# Patient Record
Sex: Female | Born: 1935 | Race: White | Hispanic: No | Marital: Married | Smoking: Never smoker
Health system: Southern US, Community
[De-identification: ages and names within clinical notes are randomized; demographics above are authoritative.]

## PROBLEM LIST (undated history)

## (undated) DIAGNOSIS — I10 Essential (primary) hypertension: Secondary | ICD-10-CM

## (undated) DIAGNOSIS — I4891 Unspecified atrial fibrillation: Secondary | ICD-10-CM

## (undated) DIAGNOSIS — E119 Type 2 diabetes mellitus without complications: Secondary | ICD-10-CM

## (undated) DIAGNOSIS — M4800 Spinal stenosis, site unspecified: Secondary | ICD-10-CM

## (undated) DIAGNOSIS — M5136 Other intervertebral disc degeneration, lumbar region: Secondary | ICD-10-CM

## (undated) DIAGNOSIS — I499 Cardiac arrhythmia, unspecified: Secondary | ICD-10-CM

## (undated) DIAGNOSIS — H409 Unspecified glaucoma: Secondary | ICD-10-CM

## (undated) DIAGNOSIS — I2510 Atherosclerotic heart disease of native coronary artery without angina pectoris: Secondary | ICD-10-CM

## (undated) HISTORY — DX: Atherosclerotic heart disease of native coronary artery without angina pectoris: I25.10

## (undated) HISTORY — PX: CATARACT EXTRACTION, BILATERAL: SHX1313

## (undated) HISTORY — DX: Unspecified glaucoma: H40.9

## (undated) HISTORY — DX: Type 2 diabetes mellitus without complications: E11.9

## (undated) HISTORY — DX: Spinal stenosis, site unspecified: M48.00

## (undated) HISTORY — PX: COLONOSCOPY: SHX174

## (undated) HISTORY — DX: Unspecified atrial fibrillation: I48.91

## (undated) HISTORY — PX: HEMORRHOID SURGERY: SHX153

## (undated) HISTORY — DX: Other intervertebral disc degeneration, lumbar region: M51.36

## (undated) HISTORY — PX: BREAST SURGERY: SHX581

## (undated) HISTORY — PX: TUBAL LIGATION: SHX77

## (undated) HISTORY — DX: Essential (primary) hypertension: I10

---

## 1985-10-22 HISTORY — PX: CHOLECYSTECTOMY: SHX55

## 2001-10-22 HISTORY — PX: CORONARY ANGIOPLASTY WITH STENT PLACEMENT: SHX49

## 2011-10-23 HISTORY — PX: UMBILICAL HERNIA REPAIR: SHX196

## 2016-08-22 ENCOUNTER — Other Ambulatory Visit (HOSPITAL_COMMUNITY): Payer: Self-pay | Admitting: Surgery

## 2016-08-22 DIAGNOSIS — R202 Paresthesia of skin: Secondary | ICD-10-CM

## 2016-08-28 ENCOUNTER — Ambulatory Visit (HOSPITAL_COMMUNITY)
Admission: RE | Admit: 2016-08-28 | Discharge: 2016-08-28 | Disposition: A | Discharge status: Home / Self Care | Payer: Medicare Other | Source: Ambulatory Visit | Attending: Surgery | Admitting: Surgery

## 2016-08-28 DIAGNOSIS — R202 Paresthesia of skin: Secondary | ICD-10-CM | POA: Insufficient documentation

## 2016-08-28 DIAGNOSIS — I739 Peripheral vascular disease, unspecified: Secondary | ICD-10-CM | POA: Insufficient documentation

## 2016-10-22 HISTORY — PX: CORONARY ARTERY BYPASS GRAFT: SHX141

## 2017-05-28 ENCOUNTER — Encounter: Payer: Self-pay | Admitting: Internal Medicine

## 2017-07-22 ENCOUNTER — Other Ambulatory Visit: Payer: Self-pay

## 2017-07-22 ENCOUNTER — Encounter: Payer: Self-pay | Admitting: Gastroenterology

## 2017-07-22 ENCOUNTER — Ambulatory Visit (INDEPENDENT_AMBULATORY_CARE_PROVIDER_SITE_OTHER): Payer: Medicare Other | Admitting: Gastroenterology

## 2017-07-22 DIAGNOSIS — K921 Melena: Secondary | ICD-10-CM | POA: Diagnosis not present

## 2017-07-22 DIAGNOSIS — R195 Other fecal abnormalities: Secondary | ICD-10-CM

## 2017-07-22 MED ORDER — NA SULFATE-K SULFATE-MG SULF 17.5-3.13-1.6 GM/177ML PO SOLN: 1.0000 | ORAL | 0 refills | Status: DC

## 2017-07-22 NOTE — Patient Instructions (Signed)
1. Colonoscopy and possible upper endoscopy as scheduled. Please see separate instructions.

## 2017-07-22 NOTE — Assessment & Plan Note (Signed)
82 year old female presenting for further evaluation of Hemoccult-positive stool, blood in stool. Stools are dark, turns the toilet water red at times. Some increased stool frequency, loose stools. Patient previously on Eliquis after her CABG in January because she developed postop A. fib. She has been off blood thinner since August. Remote colonoscopy. Discussed with patient, consider colonoscopy with possible upper endoscopy for further evaluation of heme-positive stool. Of note patient has had a decline in her ferritin overall her hemoglobin remains low normal.  I have discussed the risks, alternatives, benefits with regards to but not limited to the risk of reaction to medication, bleeding, infection, perforation and the patient is agreeable to proceed. Written consent to be obtained.

## 2017-07-22 NOTE — Progress Notes (Signed)
Primary Care Physician:  Earney Mallet, MD  Primary Gastroenterologist:  Garfield Cornea, MD   Chief Complaint  Patient presents with  . positive stool card    HPI:  Shelley Watson is a 82 y.o. female here at the request of Dr. Macarthur Critchley for further evaluation of Hemoccult-positive stool. Patient underwent a CABG in January, she developed postoperative atrial fibrillation requiring amiodarone and Eliquis. According to PCP may she had a decline in her ferritin from 81-34 in the past 3 months on iron supplement. This was reason for checking her stools which turned out positive. Patient seemed 1 is normal at 11.7, hematocrit 36.4. Labs were from August 2018. Ferritin low normal at 34.  Patient reports taking iron supplement since her cardiac surgery. Stop Eliquis 06/08/17. No longer on amiodarone. She did develop a large hematoma on the left wrist back in April while on Eliquis.   Patient reports that her cardiologist, Dr. Alroy Dust, plans to keep her off blood thinners as long as she does not get back into A. fib but if she does she will require lifelong anticoagulation. He has encouraged her to pursue a workup of heme-positive stools.  Patient has had a colonoscopy years ago, results unavailable. She states her stools are very dark. She is on iron. At times the stool turns the water red however. Denies fresh blood on the toilet tissue. No current reflux issues. Years ago she had take Prevacid. Previously did not tolerate Prilosec gastric disturbances per patient. She eats multiple small meals daily which controls her reflux. More recently she is having up to 4-5 stools a day, sometimes loose. Rarely has constipation. Denies abdominal pain.     Current Outpatient Prescriptions  Medication Sig Dispense Refill  . Acetaminophen (TYLENOL ARTHRITIS PAIN PO) Take by mouth as needed.    Marland Kitchen aspirin 81 MG chewable tablet Chew 81 mg by mouth daily.    Marland Kitchen atorvastatin (LIPITOR) 40 MG tablet Take 1 tablet by mouth  daily.    . cholecalciferol (VITAMIN D) 1000 units tablet Take 1,000 Units by mouth daily.    . Ferrous Sulfate (IRON) 325 (65 Fe) MG TABS Take 1 tablet by mouth daily.    . fluticasone (FLONASE) 50 MCG/ACT nasal spray Place 1 spray into both nostrils as needed for allergies or rhinitis.    . furosemide (LASIX) 40 MG tablet Take 1 tablet by mouth daily.    Marland Kitchen glimepiride (AMARYL) 4 MG tablet Take 2 mg by mouth daily.    Marland Kitchen latanoprost (XALATAN) 0.005 % ophthalmic solution Place 1 drop into both eyes at bedtime.    Marland Kitchen lisinopril (PRINIVIL,ZESTRIL) 5 MG tablet Take 5 mg by mouth daily.    . metFORMIN (GLUCOPHAGE) 1000 MG tablet Take 1,000 mg by mouth 2 (two) times daily.    . Multiple Vitamin (MULTIVITAMIN) tablet Take 1 tablet by mouth. Takes when she remembers    . potassium chloride SA (K-DUR,KLOR-CON) 20 MEQ tablet Take 1 tablet by mouth daily.    . timolol (BETIMOL) 0.5 % ophthalmic solution Place 1 drop into both eyes every morning.    . vitamin C (ASCORBIC ACID) 250 MG tablet Take 250 mg by mouth daily.    . Na Sulfate-K Sulfate-Mg Sulf (SUPREP BOWEL PREP KIT) 17.5-3.13-1.6 GM/180ML SOLN Take 1 kit by mouth as directed. 1 Bottle 0   No current facility-administered medications for this visit.     Allergies as of 07/22/2017 - Review Complete 07/22/2017  Allergen Reaction Noted  . Aleve [naproxen sodium] Shortness  Of Breath 07/22/2017  . Garlic  74/25/9563  . Other  07/22/2017  . Prednisone  07/22/2017  . Penicillins Rash 07/22/2017    Past Medical History:  Diagnosis Date  . A-fib ([place])    after CABG  . CAD (coronary artery disease)    stent placement  . DDD (degenerative disc disease), lumbar   . Diabetes ([place])   . Glaucoma   . Hypertension   . Spinal stenosis     Past Surgical History:  Procedure Laterality Date  . BREAST SURGERY     X5  . CATARACT EXTRACTION, BILATERAL    . CHOLECYSTECTOMY  1987  . COLONOSCOPY     Dr. Algis Greenhouse  . CORONARY ANGIOPLASTY WITH STENT  PLACEMENT  2003  . CORONARY ARTERY BYPASS GRAFT  10/2016   [facility]  . TUBAL LIGATION    . UMBILICAL HERNIA REPAIR  2013    Family History  Problem Relation Age of Onset  . Bladder Cancer Brother   . Colon cancer Neg Hx     Social History   Social History  . Marital status: Married    Spouse name: N/A  . Number of children: N/A  . Years of education: N/A   Occupational History  . Not on file.   Social History Main Topics  . Smoking status: Never Smoker  . Smokeless tobacco: Never Used  . Alcohol use No  . Drug use: No  . Sexual activity: Not on file   Other Topics Concern  . Not on file   Social History Narrative  . No narrative on file      ROS:  General: Negative for anorexia, weight loss, fever, chills,Positive fatigue fatigue, weakness. Eyes: Negative for vision changes.  ENT: Negative for hoarseness, difficulty swallowing , nasal congestion. CV: Negative for chest pain, angina, palpitations, dyspnea on exertion, peripheral edema.  Respiratory: Negative for dyspnea at rest, dyspnea on exertion, cough, sputum, wheezing.  GI: See history of present illness. GU:  Negative for dysuria, hematuria, urinary incontinence, urinary frequency, nocturnal urination.  MS: Negative for joint pain, low back pain.  Derm: Negative for rash or itching.  Neuro: Negative for weakness, abnormal sensation, seizure, frequent headaches, memory loss, confusion.  Psych: Negative for anxiety, depression, suicidal ideation, hallucinations.  Endo: Negative for unusual weight change.  Heme: Negative for bruising or bleeding. Allergy: Negative for rash or hives.    Physical Examination:  BP (!) 147/82   Pulse 71   Temp (!) 97.2 F (36.2 C) (Oral)   Ht 5' 2.5" (1.588 m)   Wt 194 lb 12.8 oz (88.4 kg)   BMI 35.06 kg/m    General: Well-nourished, well-developed in no acute distress. Accompanied by spouse Head: Normocephalic, atraumatic.   Eyes: Conjunctiva pink, no icterus. Mouth:  Oropharyngeal mucosa moist and pink , no lesions erythema or exudate. Neck: Supple without thyromegaly, masses, or lymphadenopathy.  Lungs: Clear to auscultation bilaterally.  Heart: Regular rate and rhythm, no murmurs rubs or gallops.  Abdomen: Bowel sounds are normal, nontender, nondistended, no hepatosplenomegaly or masses, no abdominal bruits or    hernia , no rebound or guarding.   Rectal: Not performed Extremities: No lower extremity edema. No clubbing or deformities.  Neuro: Alert and oriented x 4 , grossly normal neurologically.  Skin: Warm and dry, no rash or jaundice.   Psych: Alert and cooperative, normal mood and affect.  Labs: Labs from July 2018 Ferritin 34, white blood cell count 8200, hemoglobin 11.7 normal, hematocrit 36.4, MCV 82,  platelets 233,000, albumin 4, total bilirubin 0.4, alkaline phosphatase 82, AST 16, ALT 10, creatinine 1.1, BUN 15, hemoglobin A1c 6.9.  Imaging Studies: No results found.

## 2017-08-07 ENCOUNTER — Encounter (HOSPITAL_COMMUNITY): Payer: Self-pay | Admitting: *Deleted

## 2017-08-07 ENCOUNTER — Ambulatory Visit (HOSPITAL_COMMUNITY)
Admission: RE | Admit: 2017-08-07 | Discharge: 2017-08-07 | Disposition: A | Discharge status: Home / Self Care | Payer: Medicare Other | Source: Ambulatory Visit | Attending: Internal Medicine | Admitting: Internal Medicine

## 2017-08-07 ENCOUNTER — Encounter (HOSPITAL_COMMUNITY)
Admission: RE | Disposition: A | Discharge status: Home / Self Care | Payer: Self-pay | Source: Ambulatory Visit | Attending: Internal Medicine

## 2017-08-07 DIAGNOSIS — I4891 Unspecified atrial fibrillation: Secondary | ICD-10-CM | POA: Diagnosis not present

## 2017-08-07 DIAGNOSIS — M5136 Other intervertebral disc degeneration, lumbar region: Secondary | ICD-10-CM | POA: Insufficient documentation

## 2017-08-07 DIAGNOSIS — Z9049 Acquired absence of other specified parts of digestive tract: Secondary | ICD-10-CM | POA: Insufficient documentation

## 2017-08-07 DIAGNOSIS — Z9841 Cataract extraction status, right eye: Secondary | ICD-10-CM | POA: Insufficient documentation

## 2017-08-07 DIAGNOSIS — Z951 Presence of aortocoronary bypass graft: Secondary | ICD-10-CM | POA: Insufficient documentation

## 2017-08-07 DIAGNOSIS — M4800 Spinal stenosis, site unspecified: Secondary | ICD-10-CM | POA: Insufficient documentation

## 2017-08-07 DIAGNOSIS — K317 Polyp of stomach and duodenum: Secondary | ICD-10-CM | POA: Diagnosis not present

## 2017-08-07 DIAGNOSIS — Z880 Allergy status to penicillin: Secondary | ICD-10-CM | POA: Insufficient documentation

## 2017-08-07 DIAGNOSIS — Z888 Allergy status to other drugs, medicaments and biological substances status: Secondary | ICD-10-CM | POA: Insufficient documentation

## 2017-08-07 DIAGNOSIS — I2510 Atherosclerotic heart disease of native coronary artery without angina pectoris: Secondary | ICD-10-CM | POA: Insufficient documentation

## 2017-08-07 DIAGNOSIS — Z7982 Long term (current) use of aspirin: Secondary | ICD-10-CM | POA: Diagnosis not present

## 2017-08-07 DIAGNOSIS — K921 Melena: Secondary | ICD-10-CM | POA: Insufficient documentation

## 2017-08-07 DIAGNOSIS — I10 Essential (primary) hypertension: Secondary | ICD-10-CM | POA: Insufficient documentation

## 2017-08-07 DIAGNOSIS — Z7901 Long term (current) use of anticoagulants: Secondary | ICD-10-CM | POA: Insufficient documentation

## 2017-08-07 DIAGNOSIS — Z9842 Cataract extraction status, left eye: Secondary | ICD-10-CM | POA: Diagnosis not present

## 2017-08-07 DIAGNOSIS — Z7984 Long term (current) use of oral hypoglycemic drugs: Secondary | ICD-10-CM | POA: Insufficient documentation

## 2017-08-07 DIAGNOSIS — Z91018 Allergy to other foods: Secondary | ICD-10-CM | POA: Diagnosis not present

## 2017-08-07 DIAGNOSIS — K219 Gastro-esophageal reflux disease without esophagitis: Secondary | ICD-10-CM | POA: Diagnosis not present

## 2017-08-07 DIAGNOSIS — D122 Benign neoplasm of ascending colon: Secondary | ICD-10-CM | POA: Insufficient documentation

## 2017-08-07 DIAGNOSIS — H409 Unspecified glaucoma: Secondary | ICD-10-CM | POA: Diagnosis not present

## 2017-08-07 DIAGNOSIS — R195 Other fecal abnormalities: Secondary | ICD-10-CM

## 2017-08-07 DIAGNOSIS — K640 First degree hemorrhoids: Secondary | ICD-10-CM | POA: Insufficient documentation

## 2017-08-07 DIAGNOSIS — Z79899 Other long term (current) drug therapy: Secondary | ICD-10-CM | POA: Insufficient documentation

## 2017-08-07 DIAGNOSIS — Z8052 Family history of malignant neoplasm of bladder: Secondary | ICD-10-CM | POA: Insufficient documentation

## 2017-08-07 DIAGNOSIS — E119 Type 2 diabetes mellitus without complications: Secondary | ICD-10-CM | POA: Diagnosis not present

## 2017-08-07 DIAGNOSIS — Z955 Presence of coronary angioplasty implant and graft: Secondary | ICD-10-CM | POA: Insufficient documentation

## 2017-08-07 HISTORY — PX: POLYPECTOMY: SHX5525

## 2017-08-07 HISTORY — DX: Cardiac arrhythmia, unspecified: I49.9

## 2017-08-07 HISTORY — PX: COLONOSCOPY: SHX5424

## 2017-08-07 HISTORY — PX: ESOPHAGOGASTRODUODENOSCOPY: SHX5428

## 2017-08-07 LAB — GLUCOSE, CAPILLARY: Glucose-Capillary: 174 mg/dL — ABNORMAL HIGH (ref 65–99)

## 2017-08-07 SURGERY — COLONOSCOPY
Anesthesia: Moderate Sedation

## 2017-08-07 MED ORDER — ONDANSETRON HCL 4 MG/2ML IJ SOLN
INTRAMUSCULAR | Status: AC
  Filled 2017-08-07: qty 2

## 2017-08-07 MED ORDER — MEPERIDINE HCL 100 MG/ML IJ SOLN
INTRAMUSCULAR | Status: DC | PRN
  Administered 2017-08-07 (×2): 25 mg

## 2017-08-07 MED ORDER — MIDAZOLAM HCL 5 MG/5ML IJ SOLN
INTRAMUSCULAR | Status: DC | PRN
  Administered 2017-08-07 (×5): 1 mg via INTRAVENOUS

## 2017-08-07 MED ORDER — MEPERIDINE HCL 100 MG/ML IJ SOLN
INTRAMUSCULAR | Status: AC
  Filled 2017-08-07: qty 2

## 2017-08-07 MED ORDER — ATROPINE SULFATE 1 MG/ML IJ SOLN
INTRAMUSCULAR | Status: AC
  Filled 2017-08-07: qty 1

## 2017-08-07 MED ORDER — EPINEPHRINE PF 1 MG/10ML IJ SOSY
PREFILLED_SYRINGE | INTRAMUSCULAR | Status: AC
  Filled 2017-08-07: qty 10

## 2017-08-07 MED ORDER — LIDOCAINE VISCOUS 2 % MT SOLN
OROMUCOSAL | Status: DC | PRN
  Administered 2017-08-07: 1 via OROMUCOSAL

## 2017-08-07 MED ORDER — NALOXONE HCL 0.4 MG/ML IJ SOLN
INTRAMUSCULAR | Status: AC
  Filled 2017-08-07: qty 1

## 2017-08-07 MED ORDER — SODIUM CHLORIDE 0.9 % IV SOLN
INTRAVENOUS | Status: DC
  Administered 2017-08-07: 07:00:00 via INTRAVENOUS

## 2017-08-07 MED ORDER — LIDOCAINE VISCOUS 2 % MT SOLN
OROMUCOSAL | Status: AC
  Filled 2017-08-07: qty 15

## 2017-08-07 MED ORDER — STERILE WATER FOR IRRIGATION IR SOLN
Status: DC | PRN
  Administered 2017-08-07: 2.5 mL

## 2017-08-07 MED ORDER — MIDAZOLAM HCL 5 MG/5ML IJ SOLN
INTRAMUSCULAR | Status: AC
  Filled 2017-08-07: qty 10

## 2017-08-07 MED ORDER — ONDANSETRON HCL 4 MG/2ML IJ SOLN
INTRAMUSCULAR | Status: DC | PRN
  Administered 2017-08-07: 4 mg via INTRAVENOUS

## 2017-08-07 MED ORDER — FLUMAZENIL 0.5 MG/5ML IV SOLN
INTRAVENOUS | Status: AC
  Filled 2017-08-07: qty 5

## 2017-08-07 NOTE — Op Note (Addendum)
[facility] Patient Name: Shelley Watson Procedure Date: 08/07/2017 7:10 AM MRN: [id number] Date of Birth: [birth year] Attending MD: Norvel Richards , MD CSN: [id number] Age: 82 Admit Type: Outpatient Procedure:                Colonoscopy Indications:              Heme positive stool Providers:                Norvel Richards, MD, Selena Lesser, Lurline Del, RN, Aram Candela Referring MD:              Medicines:                Midazolam 2 mg IV, Meperidine 25 mg IV, Ondansetron                            4 mg IV Complications:            No immediate complications. Estimated Blood Loss:     Estimated blood loss was minimal. Procedure:                Pre-Anesthesia Assessment:                           - Prior to the procedure, a History and Physical                            was performed, and patient medications and                            allergies were reviewed. The patient's tolerance of                            previous anesthesia was also reviewed. The risks                            and benefits of the procedure and the sedation                            options and risks were discussed with the patient.                            All questions were answered, and informed consent                            was obtained. Prior Anticoagulants: The patient has                            taken no previous anticoagulant or antiplatelet                            agents. ASA Grade Assessment: II - A patient with  mild systemic disease. After reviewing the risks                            and benefits, the patient was deemed in                            satisfactory condition to undergo the procedure.                           After obtaining informed consent, the colonoscope                            was passed under direct vision. Throughout the                            procedure, the patient's blood  pressure, pulse, and                            oxygen saturations were monitored continuously. The                            EC-[id number] ([id number]) scope was introduced through                            the anus and advanced to the the cecum, identified                            by appendiceal orifice and ileocecal valve. The                            colonoscopy was performed without difficulty. The                            patient tolerated the procedure well. The quality                            of the bowel preparation was adequate. The entire                            colon was well visualized. The ileocecal valve,                            appendiceal orifice, and rectum were photographed.                            The quality of the bowel preparation was adequate. Scope In: 7:52:31 AM Scope Out: 8:04:12 AM Scope Withdrawal Time: 0 hours 7 minutes 34 seconds  Total Procedure Duration: 0 hours 11 minutes 41 seconds  Findings:      The perianal and digital rectal examinations were normal.      A 6 mm polyp was found in the ascending colon. The polyp was sessile.       The polyp was removed with a cold snare. Resection and retrieval were  complete. Estimated blood loss was minimal.      Internal hemorrhoids were found during retroflexion. The hemorrhoids       were mild, small and Grade I (internal hemorrhoids that do not prolapse).      The exam was otherwise without abnormality on direct and retroflexion       views. Impression:               - One 6 mm polyp in the ascending colon, removed                            with a cold snare. Resected and retrieved.                           - Internal hemorrhoids.                           - The examination was otherwise normal on direct                            and retroflexion views. Moderate Sedation:      Moderate (conscious) sedation was administered by the endoscopy nurse       and supervised by the endoscopist.  The following parameters were       monitored: oxygen saturation, heart rate, blood pressure, respiratory       rate, EKG, adequacy of pulmonary ventilation, and response to care.       Total physician intraservice time was 19 minutes. Recommendation:           - Patient has a contact number available for                            emergencies. The signs and symptoms of potential                            delayed complications were discussed with the                            patient. Return to normal activities tomorrow.                            Written discharge instructions were provided to the                            patient.                           - Resume previous diet.                           - Continue present medications.                           - No repeat colonoscopy due to age.                           - Return to GI clinic (date not yet determined).  See EGD report. Procedure Code(s):        --- Professional ---                           [phone], Colonoscopy, flexible; with removal of                            tumor(s), polyp(s), or other lesion(s) by snare                            technique                           99152, Moderate sedation services provided by the                            same physician or other qualified health care                            professional performing the diagnostic or                            therapeutic service that the sedation supports,                            requiring the presence of an independent trained                            observer to assist in the monitoring of the                            patient's level of consciousness and physiological                            status; initial 15 minutes of intraservice time,                            patient age 24 years or older Diagnosis Code(s):        --- Professional ---                           D12.2, Benign neoplasm of  ascending colon                           K64.0, First degree hemorrhoids                           R19.5, Other fecal abnormalities CPT copyright 2016 American Medical Association. All rights reserved. The codes documented in this report are preliminary and upon coder review may  be revised to meet current compliance requirements. [name]. Nicholos Aloisi, MD Norvel Richards, MD 08/07/2017 8:09:55 AM This report has been signed electronically. Number of Addenda: 0

## 2017-08-07 NOTE — Discharge Instructions (Addendum)
Colon polyp information provided  Further recommendations to follow pending review of pathology report  No MRI until clips noted longer present.  Colonoscopy Discharge Instructions  Read the instructions outlined below and refer to this sheet in the next few weeks. These discharge instructions provide you with general information on caring for yourself after you leave the hospital. Your doctor may also give you specific instructions. While your treatment has been planned according to the most current medical practices available, unavoidable complications occasionally occur. If you have any problems or questions after discharge, call Dr. Gala Romney at [phone]. ACTIVITY  You may resume your regular activity, but move at a slower pace for the next 24 hours.   Take frequent rest periods for the next 24 hours.   Walking will help get rid of the air and reduce the bloated feeling in your belly (abdomen).   No driving for 24 hours (because of the medicine (anesthesia) used during the test).    Do not sign any important legal documents or operate any machinery for 24 hours (because of the anesthesia used during the test).  NUTRITION  Drink plenty of fluids.   You may resume your normal diet as instructed by your doctor.   Begin with a light meal and progress to your normal diet. Heavy or fried foods are harder to digest and may make you feel sick to your stomach (nauseated).   Avoid alcoholic beverages for 24 hours or as instructed.  MEDICATIONS  You may resume your normal medications unless your doctor tells you otherwise.  WHAT YOU CAN EXPECT TODAY  Some feelings of bloating in the abdomen.   Passage of more gas than usual.   Spotting of blood in your stool or on the toilet paper.  IF YOU HAD POLYPS REMOVED DURING THE COLONOSCOPY:  No aspirin products for 7 days or as instructed.   No alcohol for 7 days or as instructed.   Eat a soft diet for the next 24 hours.  FINDING OUT THE  RESULTS OF YOUR TEST Not all test results are available during your visit. If your test results are not back during the visit, make an appointment with your caregiver to find out the results. Do not assume everything is normal if you have not heard from your caregiver or the medical facility. It is important for you to follow up on all of your test results.  SEEK IMMEDIATE MEDICAL ATTENTION IF:  You have more than a spotting of blood in your stool.   Your belly is swollen (abdominal distention).   You are nauseated or vomiting.   You have a temperature over 101.   You have abdominal pain or discomfort that is severe or gets worse throughout the day.  EGD Discharge instructions Please read the instructions outlined below and refer to this sheet in the next few weeks. These discharge instructions provide you with general information on caring for yourself after you leave the hospital. Your doctor may also give you specific instructions. While your treatment has been planned according to the most current medical practices available, unavoidable complications occasionally occur. If you have any problems or questions after discharge, please call your doctor. ACTIVITY  You may resume your regular activity but move at a slower pace for the next 24 hours.   Take frequent rest periods for the next 24 hours.   Walking will help expel (get rid of) the air and reduce the bloated feeling in your abdomen.   No driving for  24 hours (because of the anesthesia (medicine) used during the test).   You may shower.   Do not sign any important legal documents or operate any machinery for 24 hours (because of the anesthesia used during the test).  NUTRITION  Drink plenty of fluids.   You may resume your normal diet.   Begin with a light meal and progress to your normal diet.   Avoid alcoholic beverages for 24 hours or as instructed by your caregiver.  MEDICATIONS  You may resume your normal  medications unless your caregiver tells you otherwise.  WHAT YOU CAN EXPECT TODAY  You may experience abdominal discomfort such as a feeling of fullness or gas pains.  FOLLOW-UP  Your doctor will discuss the results of your test with you.  SEEK IMMEDIATE MEDICAL ATTENTION IF ANY OF THE FOLLOWING OCCUR:  Excessive nausea (feeling sick to your stomach) and/or vomiting.   Severe abdominal pain and distention (swelling).   Trouble swallowing.   Temperature over 101 F (37.8 C).   Rectal bleeding or vomiting of blood.    Colon Polyps Polyps are tissue growths inside the body. Polyps can grow in many places, including the large intestine (colon). A polyp may be a round bump or a mushroom-shaped growth. You could have one polyp or several. Most colon polyps are noncancerous (benign). However, some colon polyps can become cancerous over time. What are the causes? The exact cause of colon polyps is not known. What increases the risk? This condition is more likely to develop in people who:  Have a family history of colon cancer or colon polyps.  Are older than 60 or older than 45 if they are African American.  Have inflammatory bowel disease, such as ulcerative colitis or Crohn disease.  Are overweight.  Smoke cigarettes.  Do not get enough exercise.  Drink too much alcohol.  Eat a diet that is: ? High in fat and red meat. ? Low in fiber.  Had childhood cancer that was treated with abdominal radiation.  What are the signs or symptoms? Most polyps do not cause symptoms. If you have symptoms, they may include:  Blood coming from your rectum when having a bowel movement.  Blood in your stool.The stool may look dark red or black.  A change in bowel habits, such as constipation or diarrhea.  How is this diagnosed? This condition is diagnosed with a colonoscopy. This is a procedure that uses a lighted, flexible scope to look at the inside of your colon. How is this  treated? Treatment for this condition involves removing any polyps that are found. Those polyps will then be tested for cancer. If cancer is found, your health care provider will talk to you about options for colon cancer treatment. Follow these instructions at home: Diet  Eat plenty of fiber, such as fruits, vegetables, and whole grains.  Eat foods that are high in calcium and vitamin D, such as milk, cheese, yogurt, eggs, liver, fish, and broccoli.  Limit foods high in fat, red meats, and processed meats, such as hot dogs, sausage, bacon, and lunch meats.  Maintain a healthy weight, or lose weight if recommended by your health care provider. General instructions  Do not smoke cigarettes.  Do not drink alcohol excessively.  Keep all follow-up visits as told by your health care provider. This is important. This includes keeping regularly scheduled colonoscopies. Talk to your health care provider about when you need a colonoscopy.  Exercise every day or as told by  your health care provider. Contact a health care provider if:  You have new or worsening bleeding during a bowel movement.  You have new or increased blood in your stool.  You have a change in bowel habits.  You unexpectedly lose weight. This information is not intended to replace advice given to you by your health care provider. Make sure you discuss any questions you have with your health care provider. Document Released: 07/04/2004 Document Revised: 03/15/2016 Document Reviewed: 08/29/2015 Elsevier Interactive Patient Education  [employer].

## 2017-08-07 NOTE — H&P (View-Only) (Signed)
Primary Care Physician:  Earney Mallet, MD  Primary Gastroenterologist:  Garfield Cornea, MD   Chief Complaint  Patient presents with  . positive stool card    HPI:  Shelley Watson is a 82 y.o. female here at the request of Dr. Macarthur Critchley for further evaluation of Hemoccult-positive stool. Patient underwent a CABG in January, she developed postoperative atrial fibrillation requiring amiodarone and Eliquis. According to PCP may she had a decline in her ferritin from 81-34 in the past 3 months on iron supplement. This was reason for checking her stools which turned out positive. Patient seemed 1 is normal at 11.7, hematocrit 36.4. Labs were from August 2018. Ferritin low normal at 34.  Patient reports taking iron supplement since her cardiac surgery. Stop Eliquis 06/08/17. No longer on amiodarone. She did develop a large hematoma on the left wrist back in April while on Eliquis.   Patient reports that her cardiologist, Dr. Alroy Dust, plans to keep her off blood thinners as long as she does not get back into A. fib but if she does she will require lifelong anticoagulation. He has encouraged her to pursue a workup of heme-positive stools.  Patient has had a colonoscopy years ago, results unavailable. She states her stools are very dark. She is on iron. At times the stool turns the water red however. Denies fresh blood on the toilet tissue. No current reflux issues. Years ago she had take Prevacid. Previously did not tolerate Prilosec gastric disturbances per patient. She eats multiple small meals daily which controls her reflux. More recently she is having up to 4-5 stools a day, sometimes loose. Rarely has constipation. Denies abdominal pain.     Current Outpatient Prescriptions  Medication Sig Dispense Refill  . Acetaminophen (TYLENOL ARTHRITIS PAIN PO) Take by mouth as needed.    Marland Kitchen aspirin 81 MG chewable tablet Chew 81 mg by mouth daily.    Marland Kitchen atorvastatin (LIPITOR) 40 MG tablet Take 1 tablet by mouth  daily.    . cholecalciferol (VITAMIN D) 1000 units tablet Take 1,000 Units by mouth daily.    . Ferrous Sulfate (IRON) 325 (65 Fe) MG TABS Take 1 tablet by mouth daily.    . fluticasone (FLONASE) 50 MCG/ACT nasal spray Place 1 spray into both nostrils as needed for allergies or rhinitis.    . furosemide (LASIX) 40 MG tablet Take 1 tablet by mouth daily.    Marland Kitchen glimepiride (AMARYL) 4 MG tablet Take 2 mg by mouth daily.    Marland Kitchen latanoprost (XALATAN) 0.005 % ophthalmic solution Place 1 drop into both eyes at bedtime.    Marland Kitchen lisinopril (PRINIVIL,ZESTRIL) 5 MG tablet Take 5 mg by mouth daily.    . metFORMIN (GLUCOPHAGE) 1000 MG tablet Take 1,000 mg by mouth 2 (two) times daily.    . Multiple Vitamin (MULTIVITAMIN) tablet Take 1 tablet by mouth. Takes when she remembers    . potassium chloride SA (K-DUR,KLOR-CON) 20 MEQ tablet Take 1 tablet by mouth daily.    . timolol (BETIMOL) 0.5 % ophthalmic solution Place 1 drop into both eyes every morning.    . vitamin C (ASCORBIC ACID) 250 MG tablet Take 250 mg by mouth daily.    . Na Sulfate-K Sulfate-Mg Sulf (SUPREP BOWEL PREP KIT) 17.5-3.13-1.6 GM/180ML SOLN Take 1 kit by mouth as directed. 1 Bottle 0   No current facility-administered medications for this visit.     Allergies as of 07/22/2017 - Review Complete 07/22/2017  Allergen Reaction Noted  . Aleve [naproxen sodium] Shortness  Of Breath 07/22/2017  . Garlic  74/25/9563  . Other  07/22/2017  . Prednisone  07/22/2017  . Penicillins Rash 07/22/2017    Past Medical History:  Diagnosis Date  . A-fib ([place])    after CABG  . CAD (coronary artery disease)    stent placement  . DDD (degenerative disc disease), lumbar   . Diabetes ([place])   . Glaucoma   . Hypertension   . Spinal stenosis     Past Surgical History:  Procedure Laterality Date  . BREAST SURGERY     X5  . CATARACT EXTRACTION, BILATERAL    . CHOLECYSTECTOMY  1987  . COLONOSCOPY     Dr. Algis Greenhouse  . CORONARY ANGIOPLASTY WITH STENT  PLACEMENT  2003  . CORONARY ARTERY BYPASS GRAFT  10/2016   [facility]  . TUBAL LIGATION    . UMBILICAL HERNIA REPAIR  2013    Family History  Problem Relation Age of Onset  . Bladder Cancer Brother   . Colon cancer Neg Hx     Social History   Social History  . Marital status: Married    Spouse name: N/A  . Number of children: N/A  . Years of education: N/A   Occupational History  . Not on file.   Social History Main Topics  . Smoking status: Never Smoker  . Smokeless tobacco: Never Used  . Alcohol use No  . Drug use: No  . Sexual activity: Not on file   Other Topics Concern  . Not on file   Social History Narrative  . No narrative on file      ROS:  General: Negative for anorexia, weight loss, fever, chills,Positive fatigue fatigue, weakness. Eyes: Negative for vision changes.  ENT: Negative for hoarseness, difficulty swallowing , nasal congestion. CV: Negative for chest pain, angina, palpitations, dyspnea on exertion, peripheral edema.  Respiratory: Negative for dyspnea at rest, dyspnea on exertion, cough, sputum, wheezing.  GI: See history of present illness. GU:  Negative for dysuria, hematuria, urinary incontinence, urinary frequency, nocturnal urination.  MS: Negative for joint pain, low back pain.  Derm: Negative for rash or itching.  Neuro: Negative for weakness, abnormal sensation, seizure, frequent headaches, memory loss, confusion.  Psych: Negative for anxiety, depression, suicidal ideation, hallucinations.  Endo: Negative for unusual weight change.  Heme: Negative for bruising or bleeding. Allergy: Negative for rash or hives.    Physical Examination:  BP (!) 147/82   Pulse 71   Temp (!) 97.2 F (36.2 C) (Oral)   Ht 5' 2.5" (1.588 m)   Wt 194 lb 12.8 oz (88.4 kg)   BMI 35.06 kg/m    General: Well-nourished, well-developed in no acute distress. Accompanied by spouse Head: Normocephalic, atraumatic.   Eyes: Conjunctiva pink, no icterus. Mouth:  Oropharyngeal mucosa moist and pink , no lesions erythema or exudate. Neck: Supple without thyromegaly, masses, or lymphadenopathy.  Lungs: Clear to auscultation bilaterally.  Heart: Regular rate and rhythm, no murmurs rubs or gallops.  Abdomen: Bowel sounds are normal, nontender, nondistended, no hepatosplenomegaly or masses, no abdominal bruits or    hernia , no rebound or guarding.   Rectal: Not performed Extremities: No lower extremity edema. No clubbing or deformities.  Neuro: Alert and oriented x 4 , grossly normal neurologically.  Skin: Warm and dry, no rash or jaundice.   Psych: Alert and cooperative, normal mood and affect.  Labs: Labs from July 2018 Ferritin 34, white blood cell count 8200, hemoglobin 11.7 normal, hematocrit 36.4, MCV 82,  platelets 233,000, albumin 4, total bilirubin 0.4, alkaline phosphatase 82, AST 16, ALT 10, creatinine 1.1, BUN 15, hemoglobin A1c 6.9.  Imaging Studies: No results found.

## 2017-08-07 NOTE — Interval H&P Note (Signed)
History and Physical Interval Note:  08/07/2017 7:34 AM  Lovett Sox  has presented today for surgery, with the diagnosis of heme+ stool  The various methods of treatment have been discussed with the patient and family. After consideration of risks, benefits and other options for treatment, the patient has consented to  Procedure(s) with comments: COLONOSCOPY (N/A) - 7:30am ESOPHAGOGASTRODUODENOSCOPY (EGD) (N/A) as a surgical intervention .  The patient's history has been reviewed, patient examined, no change in status, stable for surgery.  I have reviewed the patient's chart and labs.  Questions were answered to the patient's satisfaction.     Shelley Watson  no change.  Diagnostic colonoscopy and EGD per plan.  The risks, benefits, limitations, imponderables and alternatives regarding both EGD and colonoscopy have been reviewed with the patient. Questions have been answered. All parties agreeable.

## 2017-08-07 NOTE — Op Note (Signed)
[facility] Patient Name: Shelley Watson Procedure Date: 08/07/2017 8:04 AM MRN: [id number] Date of Birth: [birth year] Attending MD: Norvel Richards , MD CSN: [id number] Age: 82 Admit Type: Outpatient Procedure:                Upper GI endoscopy Indications:              Heme positive stool Providers:                Norvel Richards, MD, Selena Lesser, Aram Candela Referring MD:              Medicines:                Midazolam 5 mg IV, Meperidine 50 mg IV, Ondansetron                            4 mg IV Complications:            No immediate complications. Estimated Blood Loss:     Estimated blood loss was minimal. Procedure:                Pre-Anesthesia Assessment:                           - Prior to the procedure, a History and Physical                            was performed, and patient medications and                            allergies were reviewed. The patient's tolerance of                            previous anesthesia was also reviewed. The risks                            and benefits of the procedure and the sedation                            options and risks were discussed with the patient.                            All questions were answered, and informed consent                            was obtained. Prior Anticoagulants: The patient has                            taken no previous anticoagulant or antiplatelet                            agents. ASA Grade Assessment: II - A patient with  mild systemic disease. After reviewing the risks                            and benefits, the patient was deemed in                            satisfactory condition to undergo the procedure.                           After obtaining informed consent, the endoscope was                            passed under direct vision. Throughout the                            procedure, the patient's blood pressure, pulse,  and                            oxygen saturations were monitored continuously. The                            [id number] ([id number]) scope was introduced through the                            mouth, and advanced to the second part of duodenum.                            The upper GI endoscopy was accomplished without                            difficulty. The patient tolerated the procedure                            well. The patient tolerated the procedure well. Scope In: 8:11:21 AM Scope Out: 8:28:48 AM Total Procedure Duration: 0 hours 17 minutes 27 seconds  Findings:      The examined esophagus was normal.      Multiple pedunculated and sessile polyps with stigmata of recent       bleeding were found in the entire examined stomach. largest pedunculated       approximately 1.5 cm in dimensions. Multiple 7-9 cm polyps were       ulcerated. The largest 2 polyps removed with a hot snare. Resection and       retrieval were complete. Estimated blood loss: none. 1 hemostasis clip       placed on each polypectomy site to prevent bleeding.      The duodenal bulb and second portion of the duodenum were normal.       Estimated blood loss was minimal. Impression:               - Normal esophagus.                           - Multiple gastric polyps. 2 largest?"Resected and  retrieved.                           - Normal duodenal bulb and second portion of the                            duodenum. I suspect patient has been intermittently                            bleeding from her stomach. Moderate Sedation:      Moderate (conscious) sedation was administered by the endoscopy nurse       and supervised by the endoscopist. The following parameters were       monitored: oxygen saturation, heart rate, blood pressure, respiratory       rate, EKG, adequacy of pulmonary ventilation, and response to care.       Total physician intraservice time was 45 minutes. Recommendation:            - Written discharge instructions were provided to                            the patient.                           - Patient has a contact number available for                            emergencies.                           - Return to normal activities tomorrow.                           - Advance diet as tolerated.                           - Continue present medications.                           - No repeat upper endoscopy.                           - Return to GI office (date not yet determined).                            See colonoscopy report. Procedure Code(s):        --- Professional ---                           [id number], Esophagogastroduodenoscopy, flexible,                            transoral; with removal of tumor(s), polyp(s), or                            other lesion(s) by snare technique  10626, Moderate sedation services provided by the                            same physician or other qualified health care                            professional performing the diagnostic or                            therapeutic service that the sedation supports,                            requiring the presence of an independent trained                            observer to assist in the monitoring of the                            patient's level of consciousness and physiological                            status; initial 15 minutes of intraservice time,                            patient age 10 years or older                           [phone], Moderate sedation services; each additional                            15 minutes intraservice time                           99153, Moderate sedation services; each additional                            15 minutes intraservice time Diagnosis Code(s):        --- Professional ---                           K31.7, Polyp of stomach and duodenum                           R19.5, Other fecal abnormalities CPT  copyright 2016 American Medical Association. All rights reserved. The codes documented in this report are preliminary and upon coder review may  be revised to meet current compliance requirements. [name]. Rourk, MD Norvel Richards, MD 08/07/2017 8:37:12 AM This report has been signed electronically. Number of Addenda: 0

## 2017-08-09 ENCOUNTER — Encounter (HOSPITAL_COMMUNITY): Payer: Self-pay | Admitting: Internal Medicine

## 2017-08-13 ENCOUNTER — Encounter: Payer: Self-pay | Admitting: Internal Medicine

## 2017-08-13 DIAGNOSIS — K921 Melena: Secondary | ICD-10-CM

## 2017-08-14 ENCOUNTER — Telehealth: Payer: Self-pay | Admitting: Internal Medicine

## 2017-08-14 ENCOUNTER — Encounter: Payer: Self-pay | Admitting: Internal Medicine

## 2017-08-14 ENCOUNTER — Telehealth: Payer: Self-pay

## 2017-08-14 DIAGNOSIS — K921 Melena: Secondary | ICD-10-CM

## 2017-08-14 NOTE — Telephone Encounter (Signed)
Pt notified and letter of results have already been sent to pt.

## 2017-08-14 NOTE — Telephone Encounter (Signed)
Pt has follow up OV with [facility] on 12/6 at 2pm and needs labs prior to appointment.

## 2017-08-14 NOTE — Telephone Encounter (Signed)
Lab orders done and letter mailed to the pt.

## 2017-08-14 NOTE — Telephone Encounter (Signed)
Per RMR-  Rourk, [name], MD  Claudina Lick, LPN; Theadora Rama        Send letter to patient.  Send copy of letter with path to referring provider and PCP.   Needs office visit in 6 weeks with extender.   Need a CBC and H. Pylori antibodies just before office visit.

## 2017-08-14 NOTE — Telephone Encounter (Signed)
Please schedule ov with extender.

## 2017-08-14 NOTE — Telephone Encounter (Signed)
OV made and letter mailed °

## 2017-09-13 ENCOUNTER — Other Ambulatory Visit: Payer: Self-pay | Admitting: Internal Medicine

## 2017-09-17 LAB — CBC WITH DIFFERENTIAL/PLATELET
BASOS ABS: 0.1 10*3/uL (ref 0.0–0.2)
Basos: 1 %
EOS (ABSOLUTE): 0.4 10*3/uL (ref 0.0–0.4)
Eos: 5 %
HEMOGLOBIN: 12.4 g/dL (ref 11.1–15.9)
Hematocrit: 37.6 % (ref 34.0–46.6)
Immature Grans (Abs): 0 10*3/uL (ref 0.0–0.1)
Immature Granulocytes: 1 %
LYMPHS ABS: 2.1 10*3/uL (ref 0.7–3.1)
Lymphs: 24 %
MCH: 27.7 pg (ref 26.6–33.0)
MCHC: 33 g/dL (ref 31.5–35.7)
MCV: 84 fL (ref 79–97)
MONOCYTES: 6 %
MONOS ABS: 0.5 10*3/uL (ref 0.1–0.9)
NEUTROS ABS: 5.6 10*3/uL (ref 1.4–7.0)
Neutrophils: 63 %
Platelets: 236 10*3/uL (ref 150–379)
RBC: 4.48 x10E6/uL (ref 3.77–5.28)
RDW: 15.2 % (ref 12.3–15.4)
WBC: 8.8 10*3/uL (ref 3.4–10.8)

## 2017-09-17 LAB — H. PYLORI ANTIBODY, IGA

## 2017-09-21 LAB — H. PYLORI ANTIBODY, IGG

## 2017-09-21 LAB — SPECIMEN STATUS REPORT

## 2017-09-26 ENCOUNTER — Encounter: Payer: Self-pay | Admitting: Gastroenterology

## 2017-09-26 ENCOUNTER — Ambulatory Visit (INDEPENDENT_AMBULATORY_CARE_PROVIDER_SITE_OTHER): Payer: Medicare Other | Admitting: Gastroenterology

## 2017-09-26 VITALS — BP 180/78 | HR 71 | Temp 97.2°F | Ht 62.0 in | Wt 199.4 lb

## 2017-09-26 DIAGNOSIS — R195 Other fecal abnormalities: Secondary | ICD-10-CM | POA: Diagnosis not present

## 2017-09-26 NOTE — Patient Instructions (Addendum)
1. I will discuss with Dr. Gala Romney regarding starting something like Prevacid giving your risk of stomach irritation with aspirin use. I will let you know what he says.  2. You should have your hemoglobin and ferritin (iron) checked monthly until documented stability. 3. If you feel like you may be seeing blood in your stool regularly, please let me know and we would update your hemoglobin then.  4. Continue iron for now.  5. We will see you back in four months. Call sooner if needed.

## 2017-09-26 NOTE — Assessment & Plan Note (Signed)
Clinically doing well. Less "orange tinted" stools since off Eliquis. Recent Hgb normal. Advised patient she should resume ASA daily as per cardiology recommendations. She may benefit from PPI given numerous gastric polyps. Will discuss with Dr. Gala Romney and let the patient know. Would recommend she have monthly CBC, ferritin until documented stability. She prefers to have this done with PCP.   If she sees frequent "orange tinted" stools, would offer CBC at that time.

## 2017-09-26 NOTE — Progress Notes (Signed)
Primary Care Physician: Earney Mallet, MD  Primary Gastroenterologist:  Garfield Cornea, MD   Chief Complaint  Patient presents with  . dark stool    pp f/u    HPI: Shelley Watson is a 82 y.o. female here for follow up. Seen in 07/2017 for heme positive stool.  Patient underwent a CABG in January, she developed postoperative atrial fibrillation requiring amiodarone and Eliquis. According to PCP may she had a decline in her ferritin from 81-34 in the past 3 months on iron supplement. This was reason for checking her stools which turned out positive. Patients hemoglobin was normal at 11.7, hematocrit 36.4. Labs were from August 2018. Ferritin low normal at 34.  Eliquis was stopped 05/2017. Developed a large hematoma on the left wrist in 01/2017. Cardiologist, Dr. Alroy Dust, plans to hold Eliquis as long as she doesn't go back into afib.  Patient had EGD/TCS in 07/2017. She had multiple gastric polyps, the largest resected. Path showed hyperplastic polyps with intestinal metaplasia. H.pylori serologies were negative. Colonoscopy showed 22mm tubular adenoma and internal hemorrhoids. No further colonoscopies recommended for surveillance purposes due to age.   Hgb 12.4 recently. Normal MCV as well.   Clinically doing well. Occasionally sees "orange tint" around the stool. No red. Stools are dark brown. Takes iron daily. Baseline 1-2 formed to loose stools for years. No abd pain. No heartburn. No vomiting. No abd pain.   She states she has been advised to take aspirin. She is taking it only every other daily.  She's afraid she will bleed.   Current Outpatient Medications  Medication Sig Dispense Refill  . Acetaminophen (TYLENOL ARTHRITIS PAIN PO) Take 1,000 mg by mouth daily as needed (ARTHRITIS PAIN).     Marland Kitchen aspirin 81 MG chewable tablet Chew 81 mg by mouth. Doesn't take everyday    . atorvastatin (LIPITOR) 40 MG tablet Take 1 tablet by mouth daily.    . cholecalciferol (VITAMIN D) 1000  units tablet Take 1,000 Units by mouth daily.    . Ferrous Sulfate (IRON) 325 (65 Fe) MG TABS Take 1 tablet by mouth daily.    Marland Kitchen Fexofenadine HCl (MUCINEX ALLERGY PO) Take by mouth as needed.    . fluticasone (FLONASE) 50 MCG/ACT nasal spray Place 1 spray into both nostrils as needed for allergies or rhinitis.    . furosemide (LASIX) 40 MG tablet Take 1 tablet by mouth 3 (three) times a week. Mon, Wed, Fri    . glimepiride (AMARYL) 4 MG tablet Take 2 mg by mouth daily.    Marland Kitchen latanoprost (XALATAN) 0.005 % ophthalmic solution Place 1 drop into both eyes at bedtime.    Marland Kitchen lisinopril (PRINIVIL,ZESTRIL) 5 MG tablet Take 5 mg by mouth daily.    . metFORMIN (GLUCOPHAGE) 1000 MG tablet Take 1,000 mg by mouth 2 (two) times daily.    . Multiple Vitamin (MULTIVITAMIN) tablet Take 1 tablet by mouth. Takes when she remembers    . timolol (BETIMOL) 0.5 % ophthalmic solution Place 1 drop into both eyes every morning.    . vitamin C (ASCORBIC ACID) 250 MG tablet Take 250 mg by mouth daily.     No current facility-administered medications for this visit.     Allergies as of 09/26/2017 - Review Complete 09/26/2017  Allergen Reaction Noted  . Aleve [naproxen sodium] Shortness Of Breath and Swelling 11/13/2016  . Garlic  29/51/8841  . Other  07/22/2017  . Prednisone  07/22/2017  . Penicillins Rash 07/22/2017  ROS:  General: Negative for anorexia, weight loss, fever, chills, fatigue, weakness. ENT: Negative for hoarseness, difficulty swallowing , nasal congestion. CV: Negative for chest pain, angina, palpitations, dyspnea on exertion, peripheral edema.  Respiratory: Negative for dyspnea at rest, dyspnea on exertion, cough, sputum, wheezing.  GI: See history of present illness. GU:  Negative for dysuria, hematuria, urinary incontinence, urinary frequency, nocturnal urination.  Endo: Negative for unusual weight change.    Physical Examination:   BP (!) 180/78   Pulse 71   Temp (!) 97.2 F (36.2 C)  (Oral)   Ht 5\' 2"  (1.575 m)   Wt 199 lb 6.4 oz (90.4 kg)   BMI 36.47 kg/m   General: Well-nourished, well-developed in no acute distress.  Eyes: No icterus. Mouth: Oropharyngeal mucosa moist and pink , no lesions erythema or exudate. Lungs: Clear to auscultation bilaterally.  Heart: Regular rate and rhythm, no murmurs rubs or gallops.  Abdomen: Bowel sounds are normal, nontender, nondistended, no hepatosplenomegaly or masses, no abdominal bruits or hernia , no rebound or guarding.   Extremities: No lower extremity edema. No clubbing or deformities. Neuro: Alert and oriented x 4   Skin: Warm and dry, no jaundice.   Psych: Alert and cooperative, normal mood and affect.  Labs:  Lab Results  Component Value Date   WBC 8.8 09/13/2017   HGB 12.4 09/13/2017   HCT 37.6 09/13/2017   MCV 84 09/13/2017   PLT 236 09/13/2017    Imaging Studies: No results found.

## 2017-09-27 NOTE — Progress Notes (Signed)
CC'ED TO PCP 

## 2017-10-01 MED ORDER — PANTOPRAZOLE SODIUM 40 MG PO TBEC: 40.0000 mg | DELAYED_RELEASE_TABLET | Freq: Every day | ORAL | 3 refills | Status: DC

## 2017-10-01 NOTE — Addendum Note (Signed)
Addended by: Mahala Menghini on: 10/01/2017 03:45 PM   Modules accepted: Orders

## 2017-10-01 NOTE — Progress Notes (Signed)
Please let patient know that RMR recommends taking PPI daily to add stomach "protection" given numerous gastric polyps and risk of bleeding.   ASA has been recommended by her cardiologist but she needs to be monitored closely to see if she tolerates. She plans to have periodic ie montly CBC, ferritin with pcp.   RX for pantoprazole 40mg  daily sent to pharmacy.

## 2017-10-04 NOTE — Progress Notes (Signed)
Lmom, waiting on a return call.  

## 2017-10-07 ENCOUNTER — Telehealth: Payer: Self-pay | Admitting: Internal Medicine

## 2017-10-07 NOTE — Progress Notes (Signed)
Pt returned call and was notified of medication Protonix was sent to [facility]. Pt is aware that Protonix will be taken for daily use. Pt will call back if she has any problems while taking Protonix.

## 2017-10-07 NOTE — Telephone Encounter (Signed)
Returned pts call. See addendum note.

## 2017-10-07 NOTE — Telephone Encounter (Signed)
PATIENT LEFT MESSAGE Friday THAT SHE WAS RETURNING A CALL FROM HERE.  PLEASE CALL BACK

## 2017-10-29 ENCOUNTER — Encounter: Payer: Self-pay | Admitting: Gastroenterology

## 2017-12-24 ENCOUNTER — Encounter: Payer: Self-pay | Admitting: Internal Medicine

## 2018-02-26 ENCOUNTER — Encounter: Payer: Self-pay | Admitting: Gastroenterology

## 2018-02-26 ENCOUNTER — Ambulatory Visit (INDEPENDENT_AMBULATORY_CARE_PROVIDER_SITE_OTHER): Payer: Medicare Other | Admitting: Gastroenterology

## 2018-02-26 VITALS — BP 144/82 | HR 59 | Temp 97.7°F | Ht 62.0 in | Wt 196.4 lb

## 2018-02-26 DIAGNOSIS — R195 Other fecal abnormalities: Secondary | ICD-10-CM

## 2018-02-26 DIAGNOSIS — D509 Iron deficiency anemia, unspecified: Secondary | ICD-10-CM | POA: Insufficient documentation

## 2018-02-26 DIAGNOSIS — D500 Iron deficiency anemia secondary to blood loss (chronic): Secondary | ICD-10-CM

## 2018-02-26 MED ORDER — PANTOPRAZOLE SODIUM 40 MG PO TBEC: 40.0000 mg | DELAYED_RELEASE_TABLET | Freq: Every day | ORAL | 5 refills | Status: DC

## 2018-02-26 NOTE — Patient Instructions (Signed)
1. Start pantoprazole (Protonix) once daily before breakfast to protect your stomach from the aspirin and reduce risk of bleeding from your stomach polyps.  2. Please have Dr. Macarthur Critchley fax copy of your next anemia labs to [place] at [phone].  3. Return to the office to see Dr. Gala Romney in four months. Call sooner if you are having any problems.

## 2018-02-26 NOTE — Progress Notes (Signed)
Primary Care Physician: Earney Mallet, MD  Primary Gastroenterologist:  Garfield Cornea, MD   Chief Complaint  Patient presents with  . heme+ stool    stool brownish red  . Diarrhea    not as frequent    HPI: Shelley Watson is a 83 y.o. female here for follow-up.  She was last seen in December.  She has a history of Hemoccult positive stool.  Back in January 2018 she underwent a CABG, she developed postoperative A. fib requiring amiodarone and Eliquis.  Towards the latter part of 2018 she had some decline in ferritin from 81 down to 34 on iron supplement.  Patient's hemoglobin remained normal at 11.7.  In April 2018 she developed a large hematoma on the left wrist.  In August 2018 Eliquis was stopped with no plans of resuming unless she went back in A. fib.  She has been recommended to take aspirin 81 mg daily is on aspirin 81 mg daily. Admits to just recently started to take it regularly.   Patient had an EGD and colonoscopy in October 2018.  She had multiple gastric polyps, the largest was resected, pathology showed hyperplastic with intestinal metaplasia.  H. pylori serologies were negative.  She had a 6 mm tubular adenoma removed from the colon, internal hemorrhoids.  No plans for future colonoscopies for surveillance purposes due to patient's age.  After last office visit we had suggested her taking PPI regularly giving numerous gastric polyps, aspirin use. She doesn't recall ever getting the prescription filled. She had decided on her own not to take the ASA so she didn't feel need to take the PPI.   She continues to have her labs through her PCP at her request.  Last labs from 02/07/2018 with normal hemoglobin at 12.5, hematocrit 39.1, MCV normal at 84.8, iron 46, TIBC 310, iron saturations 15%.  Few weeks back she decided to take her ASA daily as suggested by her cardiologist.   BM daily, 2-5. No melena, brbpr. No abd pain. No heartburn, vomiting. Appetite is good. She states  her stools are brownish red. She has typically complained of orange tinted stools. Likely insignificant. She denies abd pain.   Current Outpatient Medications  Medication Sig Dispense Refill  . Acetaminophen (TYLENOL ARTHRITIS PAIN PO) Take 1,000 mg by mouth daily as needed (ARTHRITIS PAIN).     Marland Kitchen aspirin 81 MG chewable tablet Chew 81 mg by mouth. Doesn't take everyday    . atorvastatin (LIPITOR) 40 MG tablet Take 1 tablet by mouth daily.    . cholecalciferol (VITAMIN D) 1000 units tablet Take 1,000 Units by mouth daily.    . furosemide (LASIX) 40 MG tablet Take 20 mg by mouth daily.     Marland Kitchen glimepiride (AMARYL) 4 MG tablet Take 2 mg by mouth daily.    Marland Kitchen latanoprost (XALATAN) 0.005 % ophthalmic solution Place 1 drop into both eyes at bedtime.    Marland Kitchen lisinopril (PRINIVIL,ZESTRIL) 5 MG tablet Take 5 mg by mouth daily.    . Magnesium 200 MG TABS Take by mouth daily.    . metFORMIN (GLUCOPHAGE) 1000 MG tablet Take 1,000 mg by mouth 2 (two) times daily.    . Multiple Vitamin (MULTIVITAMIN) tablet Take 1 tablet by mouth. Takes when she remembers    . potassium chloride (K-DUR,KLOR-CON) 10 MEQ tablet Take 0.5 tablets by mouth daily.    . timolol (BETIMOL) 0.5 % ophthalmic solution Place 1 drop into both eyes every morning.    Marland Kitchen  vitamin C (ASCORBIC ACID) 250 MG tablet Take 250 mg by mouth daily.     No current facility-administered medications for this visit.     Allergies as of 02/26/2018 - Review Complete 02/26/2018  Allergen Reaction Noted  . Aleve [naproxen sodium] Shortness Of Breath and Swelling 11/13/2016  . Garlic  64/33/2951  . Other  07/22/2017  . Prednisone  07/22/2017  . Penicillins Rash 07/22/2017    ROS:  General: Negative for anorexia, weight loss, fever, chills, fatigue, weakness. ENT: Negative for hoarseness, difficulty swallowing , nasal congestion. CV: Negative for chest pain, angina, palpitations, dyspnea on exertion, peripheral edema.  Respiratory: Negative for dyspnea at  rest, dyspnea on exertion, cough, sputum, wheezing.  GI: See history of present illness. GU:  Negative for dysuria, hematuria, urinary incontinence, urinary frequency, nocturnal urination.  Endo: Negative for unusual weight change.    Physical Examination:   BP (!) 144/82 (BP Location: Left Arm, Cuff Size: Normal)   Pulse (!) 59   Temp 97.7 F (36.5 C) (Oral)   Ht 5\' 2"  (1.575 m)   Wt 196 lb 6.4 oz (89.1 kg)   BMI 35.92 kg/m   General: Well-nourished, well-developed in no acute distress.  Eyes: No icterus. Mouth: Oropharyngeal mucosa moist and pink , no lesions erythema or exudate. Lungs: Clear to auscultation bilaterally.  Heart: Regular rate and rhythm, no murmurs rubs or gallops.  Abdomen: Bowel sounds are normal, nontender, nondistended, no hepatosplenomegaly or masses, no abdominal bruits or hernia , no rebound or guarding.   Extremities: No lower extremity edema. No clubbing or deformities. Neuro: Alert and oriented x 4   Skin: Warm and dry, no jaundice.   Psych: Alert and cooperative, normal mood and affect.  Labs:  See above  Imaging Studies: No results found.

## 2018-03-02 NOTE — Assessment & Plan Note (Signed)
H/O heme positive stool/IDA last year in setting of Eliquis. No longer on Eliquis. Clinically doing well with normal Hgb, improved iron studies now within normal range.   She is now back on daily ASA and benefits of PPI would likely outweigh potential side effects given her numerous gastric polyps (with known bleeding in the past). She agrees with start pantoprazole once daily. She will have her next anemia labs forwarded from PCP office as available. She will call in interim with any problems. Otherwise see her back in 4 months.   H/o orange/red tinted brown stools chronically and likely insignificant given normal CBC.

## 2018-03-03 NOTE — Progress Notes (Signed)
CC'D TO PCP °

## 2018-06-02 ENCOUNTER — Encounter: Payer: Self-pay | Admitting: Internal Medicine

## 2021-01-06 ENCOUNTER — Other Ambulatory Visit: Payer: Self-pay

## 2021-01-06 ENCOUNTER — Encounter: Payer: Self-pay | Admitting: Gastroenterology

## 2021-01-06 ENCOUNTER — Ambulatory Visit (INDEPENDENT_AMBULATORY_CARE_PROVIDER_SITE_OTHER): Payer: Medicare Other | Admitting: Gastroenterology

## 2021-01-06 VITALS — BP 177/74 | HR 59 | Temp 97.3°F | Ht 62.0 in | Wt 187.8 lb

## 2021-01-06 DIAGNOSIS — K317 Polyp of stomach and duodenum: Secondary | ICD-10-CM

## 2021-01-06 DIAGNOSIS — R197 Diarrhea, unspecified: Secondary | ICD-10-CM | POA: Diagnosis not present

## 2021-01-06 NOTE — Patient Instructions (Signed)
1. Please have lab work and stool test done.  We will be in touch when results are available and discuss possible options for follow-up of gastric polyps. 2. Add fiber supplement once daily such as Benefiber (powder or chewables) or Fiberchoice chewables.

## 2021-01-06 NOTE — Progress Notes (Signed)
Primary Care Physician:  Earney Mallet, MD  Primary Gastroenterologist:  Garfield Cornea, MD   Chief Complaint  Patient presents with  . Diarrhea    Diarrhea comes and goes, polyps in stomach per pt, pain from hip to hip, foul odor with diarrhea, feels up fast, hard stomach, stays hungry    HPI:  Shelley Watson is a 86 y.o. female here for further evaluation of diarrhea. Last seen in 02/2018.   Patient had an EGD and colonoscopy in October 2018.  She had multiple gastric polyps, the largest was resected, pathology showed hyperplastic with intestinal metaplasia.  H. pylori serologies were negative.  She had a 6 mm tubular adenoma removed from the colon, internal hemorrhoids.  No plans for future colonoscopies for surveillance purposes due to patient's age.  Over the past two months she has had change in bowels with diarrhea, forceful and urgent, associated with fecal incontinence. Some nocturnal diarrhea. Has had no more than a few days in the past two months without diarrhea. Stools range from watery to gravy consistence. Sometimes pieces of stool floating on the water. Stools smell like a "sick smell" when stools are loose. Handles fresh eggs. She has mid abdominal soreness and bloating.  No brbpr. Tried coming off magnesium to see if diarrhea would improve but there was no change. No recent change in metformin, has bee no same dose forever. No ASA powders, ibuprofen.   Appetite is good but feels full quickly. Eats more frequently but states she has been doing this anyway due to her diabetes. She feels hungry all the time. No vomiting. no heartburn. She is worried about the numerous gastric polyps seen on prior EGD. No significant weight loss.   Umbilical hernia repair in 2014.    No asa powders. Ibuprofen.   Patient wonders if gastric polyps need to be followed up on. She worries about getting cancer. Wants to avoid being put to sleep but wants what is appropriate.      Current  Outpatient Medications  Medication Sig Dispense Refill  . acetaminophen (TYLENOL) 500 MG tablet Take 500 mg by mouth as needed. Takes 2 tablets as needed.    Marland Kitchen amLODipine (NORVASC) 5 MG tablet Take 5 mg by mouth daily.    Marland Kitchen aspirin 81 MG chewable tablet Chew 81 mg by mouth. Doesn't take everyday    . atorvastatin (LIPITOR) 40 MG tablet Take 1 tablet by mouth daily.    . Carboxymethylcellul-Glycerin (EQ LUBRICATING EYE DROPS OP) Apply to eye 3 (three) times daily.    . cholecalciferol (VITAMIN D) 1000 units tablet Take 1,000 Units by mouth daily.    . Ferrous Sulfate (IRON) 325 (65 Fe) MG TABS Take by mouth daily.    . furosemide (LASIX) 40 MG tablet Take 20 mg by mouth 3 (three) times a week.    Marland Kitchen glimepiride (AMARYL) 4 MG tablet Take 1 mg by mouth 2 (two) times daily. Takes 1 mg twice daily.    . hydrochlorothiazide (HYDRODIURIL) 12.5 MG tablet Take 12.5 mg by mouth every morning.    Marland Kitchen lisinopril (PRINIVIL,ZESTRIL) 5 MG tablet Take 20 mg by mouth 2 (two) times daily at 10 AM and 5 PM. Takes 20 mg twice daily.    . metFORMIN (GLUCOPHAGE) 1000 MG tablet Take 1,000 mg by mouth 2 (two) times daily.    . metoprolol tartrate (LOPRESSOR) 25 MG tablet Take 12.5 mg by mouth 2 (two) times daily.    . Multiple Vitamin (MULTIVITAMIN) tablet Take 1  tablet by mouth. Takes when she remembers    . potassium chloride (K-DUR,KLOR-CON) 10 MEQ tablet Take 10 mEq by mouth 3 (three) times a week.    . triamcinolone (NASACORT ALLERGY 24HR) 55 MCG/ACT AERO nasal inhaler Place 2 sprays into the nose as needed.    . vitamin B-12 (CYANOCOBALAMIN) 1000 MCG tablet Take 1,000 mcg by mouth daily.    . vitamin C (ASCORBIC ACID) 250 MG tablet Take 250 mg by mouth daily.     No current facility-administered medications for this visit.    Allergies as of 01/06/2021 - Review Complete 01/06/2021  Allergen Reaction Noted  . Aleve [naproxen sodium] Shortness Of Breath and Swelling 11/13/2016  . Garlic  03/50/0938  .  Omeprazole  02/26/2018  . Other  07/22/2017  . Prednisone  07/22/2017  . Penicillins Rash 07/22/2017    Past Medical History:  Diagnosis Date  . A-fib ([place])    after CABG  . CAD (coronary artery disease)    stent placement  . DDD (degenerative disc disease), lumbar   . Diabetes ([place])   . Dysrhythmia    A-fib  . Glaucoma   . Hypertension   . Spinal stenosis     Past Surgical History:  Procedure Laterality Date  . BREAST SURGERY     X5  . CATARACT EXTRACTION, BILATERAL    . CHOLECYSTECTOMY  1987  . COLONOSCOPY     Dr. Algis Greenhouse  . COLONOSCOPY N/A 08/07/2017   Dr. Gala Romney: Tubular adenoma removed, internal hemorrhoids.  No future surveillance colonoscopies due to age.  . CORONARY ANGIOPLASTY WITH STENT PLACEMENT  2003  . CORONARY ARTERY BYPASS GRAFT  10/2016   [facility]  . ESOPHAGOGASTRODUODENOSCOPY N/A 08/07/2017   Dr. Gala Romney: Numerous gastric polyps, largest one sent for path and was hyperplastic with intestinal metaplasia.  Marland Kitchen HEMORRHOID SURGERY    . POLYPECTOMY  08/07/2017   Procedure: POLYPECTOMY;  Surgeon: Daneil Dolin, MD;  Location: [facility];  Service: Endoscopy;;  Ascending colon & Gastric (x2)   . TUBAL LIGATION    . UMBILICAL HERNIA REPAIR  2013    Family History  Problem Relation Age of Onset  . Bladder Cancer Brother   . Heart disease Mother   . Atrial fibrillation Sister   . Colon cancer Neg Hx     Social History   Socioeconomic History  . Marital status: Married    Spouse name: Not on file  . Number of children: Not on file  . Years of education: Not on file  . Highest education level: Not on file  Occupational History  . Not on file  Tobacco Use  . Smoking status: Never Smoker  . Smokeless tobacco: Never Used  Vaping Use  . Vaping Use: Never used  Substance and Sexual Activity  . Alcohol use: No  . Drug use: No  . Sexual activity: Not on file  Other Topics Concern  . Not on file  Social History Narrative  . Not on file   Social  Determinants of Health   Financial Resource Strain: Not on file  Food Insecurity: Not on file  Transportation Needs: Not on file  Physical Activity: Not on file  Stress: Not on file  Social Connections: Not on file  Intimate Partner Violence: Not on file      ROS:  General: Negative for anorexia, weight loss, fever, chills, fatigue, weakness. Eyes: Negative for vision changes.  ENT: Negative for hoarseness, difficulty swallowing , nasal congestion. CV: Negative for  chest pain, angina, palpitations, dyspnea on exertion, peripheral edema.  Respiratory: Negative for dyspnea at rest, dyspnea on exertion, cough, sputum, wheezing.  GI: See history of present illness. GU:  Negative for dysuria, hematuria, urinary incontinence, urinary frequency, nocturnal urination.  MS: positive for joint pain, low back pain.  Derm: Negative for rash or itching.  Neuro: Negative for weakness, abnormal sensation, seizure, frequent headaches, memory loss, confusion.  Psych: Negative for anxiety, depression, suicidal ideation, hallucinations.  Endo: Negative for unusual weight change.  Heme: Negative for bruising or bleeding. Allergy: Negative for rash or hives.    Physical Examination:  BP (!) 177/74   Pulse (!) 59   Temp (!) 97.3 F (36.3 C) (Temporal)   Ht 5\' 2"  (1.575 m)   Wt 187 lb 12.8 oz (85.2 kg)   BMI 34.35 kg/m    General: Well-nourished, well-developed in no acute distress.  Head: Normocephalic, atraumatic.   Eyes: Conjunctiva pink, no icterus. Mouth:masked. Neck: Supple without thyromegaly, masses, or lymphadenopathy.  Lungs: Clear to auscultation bilaterally.  Heart: Regular rate and rhythm, no murmurs rubs or gallops.  Abdomen: Bowel sounds are normal, nontender, nondistended, no hepatosplenomegaly or masses, no abdominal bruits or    hernia , no rebound or guarding.   Rectal: not performed Extremities: No lower extremity edema. No clubbing or deformities.  Neuro: Alert and  oriented x 4 , grossly normal neurologically.  Skin: Warm and dry, no rash or jaundice.   Psych: Alert and cooperative, normal mood and affect.  Assessment/Plan: 86 y/o female presenting with persistent diarrhea, mid abdominal pain. H/o numerous gastric polyps on previous EGD.   Symptoms of diarrhea and abdominal pain for couple of months. Nocturnal stools. Malodorous smell. Need to exclude infectious etiology. Denies recent antibiotics, ill contacts, travel. She handles fresh eggs.   Gastric polyps: see on EGD in 2018. Largest polyp removed and path showed hyperplastic polyp with intestinal metaplasia.    1. Labs. Stool studies.  2. Add fiber supplement once daily such as benefiber or fiberchoice.  3. To discuss potential surveillance EGD with Dr. Gala Romney.

## 2021-01-07 LAB — LIPASE: Lipase: 128 U/L — ABNORMAL HIGH (ref 7–60)

## 2021-01-07 LAB — CBC WITH DIFFERENTIAL/PLATELET
Absolute Monocytes: 931 cells/uL (ref 200–950)
Basophils Absolute: 154 cells/uL (ref 0–200)
Basophils Relative: 1.6 %
Eosinophils Absolute: 902 cells/uL — ABNORMAL HIGH (ref 15–500)
Eosinophils Relative: 9.4 %
HCT: 38.3 % (ref 35.0–45.0)
Hemoglobin: 12.5 g/dL (ref 11.7–15.5)
Lymphs Abs: 2285 cells/uL (ref 850–3900)
MCH: 29 pg (ref 27.0–33.0)
MCHC: 32.6 g/dL (ref 32.0–36.0)
MCV: 88.9 fL (ref 80.0–100.0)
MPV: 11.3 fL (ref 7.5–12.5)
Monocytes Relative: 9.7 %
Neutro Abs: 5328 cells/uL (ref 1500–7800)
Neutrophils Relative %: 55.5 %
Platelets: 201 10*3/uL (ref 140–400)
RBC: 4.31 10*6/uL (ref 3.80–5.10)
RDW: 13.1 % (ref 11.0–15.0)
Total Lymphocyte: 23.8 %
WBC: 9.6 10*3/uL (ref 3.8–10.8)

## 2021-01-07 LAB — COMPREHENSIVE METABOLIC PANEL
AG Ratio: 1.4 (calc) (ref 1.0–2.5)
ALT: 26 U/L (ref 6–29)
AST: 36 U/L — ABNORMAL HIGH (ref 10–35)
Albumin: 4 g/dL (ref 3.6–5.1)
Alkaline phosphatase (APISO): 79 U/L (ref 37–153)
BUN/Creatinine Ratio: 16 (calc) (ref 6–22)
BUN: 16 mg/dL (ref 7–25)
CO2: 27 mmol/L (ref 20–32)
Calcium: 8.9 mg/dL (ref 8.6–10.4)
Chloride: 102 mmol/L (ref 98–110)
Creat: 0.98 mg/dL — ABNORMAL HIGH (ref 0.60–0.88)
Globulin: 2.9 g/dL (calc) (ref 1.9–3.7)
Glucose, Bld: 73 mg/dL (ref 65–139)
Potassium: 4.6 mmol/L (ref 3.5–5.3)
Sodium: 140 mmol/L (ref 135–146)
Total Bilirubin: 0.4 mg/dL (ref 0.2–1.2)
Total Protein: 6.9 g/dL (ref 6.1–8.1)

## 2021-01-07 LAB — TSH: TSH: 1.61 mIU/L (ref 0.40–4.50)

## 2021-01-10 NOTE — Progress Notes (Signed)
CC'ED TO PCP 

## 2021-01-13 LAB — C. DIFFICILE GDH AND TOXIN A/B
GDH ANTIGEN: NOT DETECTED
MICRO NUMBER:: 11673255
SPECIMEN QUALITY:: ADEQUATE
TOXIN A AND B: NOT DETECTED

## 2021-01-13 LAB — GASTROINTESTINAL PATHOGEN PANEL PCR

## 2021-01-16 ENCOUNTER — Other Ambulatory Visit: Payer: Self-pay | Admitting: Gastroenterology

## 2021-01-16 DIAGNOSIS — R197 Diarrhea, unspecified: Secondary | ICD-10-CM

## 2021-01-24 LAB — GIARDIA/CRYPTOSPORIDIUM EIA
Cryptosporidium EIA: NEGATIVE
Giardia Ag, Stl: NEGATIVE

## 2021-01-24 LAB — STOOL CULTURE: E coli, Shiga toxin Assay: NEGATIVE

## 2021-01-30 ENCOUNTER — Telehealth: Payer: Self-pay | Admitting: Internal Medicine

## 2021-01-30 NOTE — Telephone Encounter (Signed)
[phone]  Patient called and asked about her lab results.  They were supposed to look over the labs to find out if she needed a tcs and endo

## 2021-01-30 NOTE — Telephone Encounter (Signed)
See result note.  

## 2021-02-01 ENCOUNTER — Other Ambulatory Visit: Payer: Self-pay

## 2021-02-01 DIAGNOSIS — R748 Abnormal levels of other serum enzymes: Secondary | ICD-10-CM

## 2021-02-01 DIAGNOSIS — Z79899 Other long term (current) drug therapy: Secondary | ICD-10-CM

## 2021-02-08 NOTE — Progress Notes (Signed)
Patient called to add mirapex to medication list.

## 2021-02-27 ENCOUNTER — Other Ambulatory Visit (HOSPITAL_COMMUNITY)
Admission: RE | Admit: 2021-02-27 | Discharge: 2021-02-27 | Disposition: A | Discharge status: Home / Self Care | Payer: Medicare Other | Source: Ambulatory Visit | Attending: Internal Medicine | Admitting: Internal Medicine

## 2021-02-27 ENCOUNTER — Other Ambulatory Visit: Payer: Self-pay

## 2021-02-27 DIAGNOSIS — Z20822 Contact with and (suspected) exposure to covid-19: Secondary | ICD-10-CM | POA: Insufficient documentation

## 2021-02-27 DIAGNOSIS — Z01812 Encounter for preprocedural laboratory examination: Secondary | ICD-10-CM | POA: Insufficient documentation

## 2021-02-27 LAB — SARS CORONAVIRUS 2 (TAT 6-24 HRS): SARS Coronavirus 2: NEGATIVE

## 2021-02-28 ENCOUNTER — Telehealth: Payer: Self-pay

## 2021-02-28 NOTE — Telephone Encounter (Signed)
Pt called office and LMOVM that she hadn't received call from hospital about her procedure for tomorrow.  Called pt, states she received call before last procedure about taking a shower and not wearing deodorant. Gave her Melanie's (at endo) phone#.

## 2021-03-01 ENCOUNTER — Encounter (HOSPITAL_COMMUNITY): Payer: Self-pay | Admitting: Internal Medicine

## 2021-03-01 ENCOUNTER — Other Ambulatory Visit: Payer: Self-pay

## 2021-03-01 ENCOUNTER — Encounter (HOSPITAL_COMMUNITY)
Admission: RE | Disposition: A | Discharge status: Home / Self Care | Payer: Self-pay | Source: Home / Self Care | Attending: Internal Medicine

## 2021-03-01 ENCOUNTER — Ambulatory Visit (HOSPITAL_COMMUNITY)
Admission: RE | Admit: 2021-03-01 | Discharge: 2021-03-01 | Disposition: A | Discharge status: Home / Self Care | Payer: Medicare Other | Attending: Internal Medicine | Admitting: Internal Medicine

## 2021-03-01 DIAGNOSIS — Z8052 Family history of malignant neoplasm of bladder: Secondary | ICD-10-CM | POA: Diagnosis not present

## 2021-03-01 DIAGNOSIS — K529 Noninfective gastroenteritis and colitis, unspecified: Secondary | ICD-10-CM | POA: Insufficient documentation

## 2021-03-01 DIAGNOSIS — Z79899 Other long term (current) drug therapy: Secondary | ICD-10-CM | POA: Diagnosis not present

## 2021-03-01 DIAGNOSIS — Z888 Allergy status to other drugs, medicaments and biological substances status: Secondary | ICD-10-CM | POA: Insufficient documentation

## 2021-03-01 DIAGNOSIS — Z951 Presence of aortocoronary bypass graft: Secondary | ICD-10-CM | POA: Insufficient documentation

## 2021-03-01 DIAGNOSIS — Z880 Allergy status to penicillin: Secondary | ICD-10-CM | POA: Insufficient documentation

## 2021-03-01 DIAGNOSIS — Z86010 Personal history of colonic polyps: Secondary | ICD-10-CM | POA: Diagnosis not present

## 2021-03-01 DIAGNOSIS — Z09 Encounter for follow-up examination after completed treatment for conditions other than malignant neoplasm: Secondary | ICD-10-CM | POA: Insufficient documentation

## 2021-03-01 DIAGNOSIS — Z955 Presence of coronary angioplasty implant and graft: Secondary | ICD-10-CM | POA: Diagnosis not present

## 2021-03-01 DIAGNOSIS — Z7982 Long term (current) use of aspirin: Secondary | ICD-10-CM | POA: Insufficient documentation

## 2021-03-01 DIAGNOSIS — K2950 Unspecified chronic gastritis without bleeding: Secondary | ICD-10-CM | POA: Insufficient documentation

## 2021-03-01 DIAGNOSIS — K317 Polyp of stomach and duodenum: Secondary | ICD-10-CM

## 2021-03-01 DIAGNOSIS — Z7984 Long term (current) use of oral hypoglycemic drugs: Secondary | ICD-10-CM | POA: Diagnosis not present

## 2021-03-01 HISTORY — PX: POLYPECTOMY: SHX5525

## 2021-03-01 HISTORY — PX: BIOPSY: SHX5522

## 2021-03-01 HISTORY — PX: ESOPHAGOGASTRODUODENOSCOPY: SHX5428

## 2021-03-01 HISTORY — PX: HEMOSTASIS CLIP PLACEMENT: SHX6857

## 2021-03-01 LAB — GLUCOSE, CAPILLARY: Glucose-Capillary: 85 mg/dL (ref 70–99)

## 2021-03-01 SURGERY — EGD (ESOPHAGOGASTRODUODENOSCOPY)
Anesthesia: Moderate Sedation

## 2021-03-01 MED ORDER — MEPERIDINE HCL 50 MG/ML IJ SOLN
INTRAMUSCULAR | Status: AC
  Filled 2021-03-01: qty 1

## 2021-03-01 MED ORDER — ONDANSETRON HCL 4 MG/2ML IJ SOLN
INTRAMUSCULAR | Status: AC
  Filled 2021-03-01: qty 2

## 2021-03-01 MED ORDER — LIDOCAINE VISCOUS HCL 2 % MT SOLN
OROMUCOSAL | Status: AC
  Filled 2021-03-01: qty 15

## 2021-03-01 MED ORDER — SODIUM CHLORIDE 0.9 % IV SOLN: INTRAVENOUS | Status: DC

## 2021-03-01 MED ORDER — MIDAZOLAM HCL 5 MG/5ML IJ SOLN
INTRAMUSCULAR | Status: DC | PRN
  Administered 2021-03-01: 1 mg via INTRAVENOUS

## 2021-03-01 MED ORDER — STERILE WATER FOR IRRIGATION IR SOLN
Status: DC | PRN
  Administered 2021-03-01: 100 mL

## 2021-03-01 MED ORDER — MEPERIDINE HCL 100 MG/ML IJ SOLN
INTRAMUSCULAR | Status: DC | PRN
  Administered 2021-03-01: 25 mg via INTRAVENOUS

## 2021-03-01 MED ORDER — MIDAZOLAM HCL 5 MG/5ML IJ SOLN
INTRAMUSCULAR | Status: AC
  Filled 2021-03-01: qty 10

## 2021-03-01 MED ORDER — LIDOCAINE VISCOUS HCL 2 % MT SOLN
OROMUCOSAL | Status: DC | PRN
  Administered 2021-03-01: 4 mL via OROMUCOSAL

## 2021-03-01 MED ORDER — ONDANSETRON HCL 4 MG/2ML IJ SOLN
INTRAMUSCULAR | Status: DC | PRN
  Administered 2021-03-01: 4 mg via INTRAVENOUS

## 2021-03-01 NOTE — Discharge Instructions (Signed)
EGD Discharge instructions Please read the instructions outlined below and refer to this sheet in the next few weeks. These discharge instructions provide you with general information on caring for yourself after you leave the hospital. Your doctor may also give you specific instructions. While your treatment has been planned according to the most current medical practices available, unavoidable complications occasionally occur. If you have any problems or questions after discharge, please call your doctor. ACTIVITY  You may resume your regular activity but move at a slower pace for the next 24 hours.   Take frequent rest periods for the next 24 hours.   Walking will help expel (get rid of) the air and reduce the bloated feeling in your abdomen.   No driving for 24 hours (because of the anesthesia (medicine) used during the test).   You may shower.   Do not sign any important legal documents or operate any machinery for 24 hours (because of the anesthesia used during the test).  NUTRITION  Drink plenty of fluids.   You may resume your normal diet.   Begin with a light meal and progress to your normal diet.   Avoid alcoholic beverages for 24 hours or as instructed by your caregiver.  MEDICATIONS  You may resume your normal medications unless your caregiver tells you otherwise.  WHAT YOU CAN EXPECT TODAY  You may experience abdominal discomfort such as a feeling of fullness or "gas" pains.  FOLLOW-UP  Your doctor will discuss the results of your test with you.  SEEK IMMEDIATE MEDICAL ATTENTION IF ANY OF THE FOLLOWING OCCUR:  Excessive nausea (feeling sick to your stomach) and/or vomiting.   Severe abdominal pain and distention (swelling).   Trouble swallowing.   Temperature over 101 F (37.8 C).   Rectal bleeding or vomiting of blood.   2 polyps removed from your stomach today  Clip placed on the one so it site would not bleed.  It will fall off 1 day and go out with a  bowel movement without you knowing it  No MRI until clip gone  Additional samples of your stomach and duodenum taken  Begin Protonix 40 mg once daily to help calm your stomach  Further recommendations to follow pending review of pathology report  At patient request, I called James at [phone] -rolled to voicemail.  Message stated "mailbox full"

## 2021-03-01 NOTE — H&P (Signed)
@LOGO @   Primary Care Physician:  Earney Mallet, MD Primary Gastroenterologist:  Dr. Gala Romney  Pre-Procedure History & Physical: HPI:  Shelley Watson is a 86 y.o. female here for follow-up of gastric polyps and chronic diarrhea.  No dysphagia.  Patient desirous of follow-up EGD to reassess gastric polyps previously noted.  Intestinal metaplasia. Denies dysphagia.  Past Medical History:  Diagnosis Date  . A-fib ([place])    after CABG  . CAD (coronary artery disease)    stent placement  . DDD (degenerative disc disease), lumbar   . Diabetes ([place])   . Dysrhythmia    A-fib  . Glaucoma   . Hypertension   . Spinal stenosis     Past Surgical History:  Procedure Laterality Date  . BREAST SURGERY     X5  . CATARACT EXTRACTION, BILATERAL    . CHOLECYSTECTOMY  1987  . COLONOSCOPY     Dr. Algis Greenhouse  . COLONOSCOPY N/A 08/07/2017   Dr. Gala Romney: Tubular adenoma removed, internal hemorrhoids.  No future surveillance colonoscopies due to age.  . CORONARY ANGIOPLASTY WITH STENT PLACEMENT  2003  . CORONARY ARTERY BYPASS GRAFT  10/2016   [facility]  . ESOPHAGOGASTRODUODENOSCOPY N/A 08/07/2017   Dr. Gala Romney: Numerous gastric polyps, largest one sent for path and was hyperplastic with intestinal metaplasia.  Marland Kitchen HEMORRHOID SURGERY    . POLYPECTOMY  08/07/2017   Procedure: POLYPECTOMY;  Surgeon: Daneil Dolin, MD;  [facility];  Service: Endoscopy;;  Ascending colon & Gastric (x2)   . TUBAL LIGATION    . UMBILICAL HERNIA REPAIR  2013    Prior to Admission medications   Medication Sig Start Date End Date Taking? Authorizing Provider  acetaminophen (TYLENOL) 500 MG tablet Take 1,000 mg by mouth 2 (two) times daily as needed for mild pain.   Yes [provider]  amLODipine (NORVASC) 5 MG tablet Take 5 mg by mouth daily.   Yes [provider]  aspirin 81 MG chewable tablet Chew 81 mg by mouth at bedtime.   Yes [provider]  atorvastatin (LIPITOR) 40 MG tablet  Take 40 mg by mouth at bedtime.   Yes [provider]  Camphor-Menthol-Methyl Sal (SALONPAS) 3.10-27-08 % PTCH Apply 1 patch topically daily as needed (pain).   Yes [provider]  Carboxymethylcellul-Glycerin (EQ LUBRICATING EYE DROPS OP) Apply 1 drop to eye in the morning, at noon, and at bedtime.   Yes [provider]  cholecalciferol (VITAMIN D) 1000 units tablet Take 1,000 Units by mouth daily.   Yes [provider]  Ferrous Sulfate (IRON) 325 (65 Fe) MG TABS Take 325 mg by mouth daily with breakfast.   Yes [provider]  furosemide (LASIX) 20 MG tablet Take 20 mg by mouth every Monday, Wednesday, and Friday. Hold if no ankle swelling. 01/02/21  Yes [provider]  glimepiride (AMARYL) 1 MG tablet Take 1 mg by mouth 2 (two) times daily with breakfast and lunch. Breakfast and lunch 01/24/21  Yes [provider]  hydrochlorothiazide (HYDRODIURIL) 12.5 MG tablet Take 12.5 mg by mouth every morning. 12/05/20  Yes [provider]  lisinopril (ZESTRIL) 20 MG tablet Take 20 mg by mouth 2 (two) times daily. 01/24/21  Yes [provider]  Magnesium 500 MG TABS Take 250 mg by mouth daily.   Yes [provider]  metFORMIN (GLUCOPHAGE) 1000 MG tablet Take by mouth 2 (two) times daily with a meal. 04/30/17  Yes [provider]  metoprolol tartrate (  LOPRESSOR) 25 MG tablet Take 12.5 mg by mouth 2 (two) times daily. 10/26/20  Yes [provider]  Multiple Vitamin (MULTIVITAMIN) tablet Take 1 tablet by mouth daily. Takes when she remembers   Yes [provider]  potassium chloride (KLOR-CON) 10 MEQ tablet Take 10 mEq by mouth every Monday, Wednesday, and Friday. Take with each furosemide dose. 12/06/20  Yes [provider]  pramipexole (MIRAPEX) 0.125 MG tablet Take 0.125 mg by mouth daily with supper. 01/31/21  Yes [provider]  triamcinolone (NASACORT) 55 MCG/ACT AERO nasal inhaler  Place 2 sprays into the nose daily as needed (allergies).   Yes [provider]  vitamin B-12 (CYANOCOBALAMIN) 1000 MCG tablet Take 1,000 mcg by mouth daily.   Yes [provider]  vitamin C (ASCORBIC ACID) 250 MG tablet Take 250 mg by mouth daily.   Yes [provider]    Allergies as of 01/31/2021 - Review Complete 01/06/2021  Allergen Reaction Noted  . Aleve [naproxen sodium] Shortness Of Breath and Swelling 11/13/2016  . Garlic  66/03/3015  . Omeprazole  02/26/2018  . Other  07/22/2017  . Prednisone  07/22/2017  . Penicillins Rash 07/22/2017    Family History  Problem Relation Age of Onset  . Bladder Cancer Brother   . Heart disease Mother   . Atrial fibrillation Sister   . Colon cancer Neg Hx     Social History   Socioeconomic History  . Marital status: Married    Spouse name: Not on file  . Number of children: Not on file  . Years of education: Not on file  . Highest education level: Not on file  Occupational History  . Not on file  Tobacco Use  . Smoking status: Never Smoker  . Smokeless tobacco: Never Used  Vaping Use  . Vaping Use: Never used  Substance and Sexual Activity  . Alcohol use: No  . Drug use: No  . Sexual activity: Not on file  Other Topics Concern  . Not on file  Social History Narrative  . Not on file   Social Determinants of Health   Financial Resource Strain: Not on file  Food Insecurity: Not on file  Transportation Needs: Not on file  Physical Activity: Not on file  Stress: Not on file  Social Connections: Not on file  Intimate Partner Violence: Not on file    Review of Systems: See HPI, otherwise negative ROS  Physical Exam: BP (!) 180/77   Pulse 67   Temp 97.9 F (36.6 C) (Oral)   Resp (!) 24   Ht 5\' 2"  (1.575 m)   Wt 87.5 kg   SpO2 96%   BMI 35.30 kg/m  General:   Alert,  Well-developed, well-nourished, pleasant and cooperative in NAD Neck:  Supple; no masses or thyromegaly. No significant  cervical adenopathy. Lungs:  Clear throughout to auscultation.   No wheezes, crackles, or rhonchi. No acute distress. Heart:  Regular rate and rhythm; no murmurs, clicks, rubs,  or gallops. Abdomen: Non-distended, normal bowel sounds.  Soft and nontender without appreciable mass or hepatosplenomegaly.  Pulses:  Normal pulses noted. Extremities:  Without clubbing or edema.  Impression/Plan: Pleasant 85 year old lady here for surveillance EGD to reassess gastric polyps and potentially biopsy small bowel to further evaluate idiopathic diarrhea.  The risks, benefits, limitations, alternatives and imponderables have been reviewed with the patient. Potential for esophageal dilation, biopsy, etc. have also been reviewed.  Questions have been answered. All parties agreeable.  Notice: This dictation was prepared with Dragon dictation along with smaller phrase technology. Any transcriptional errors that result from this process are unintentional and may not be corrected upon review.

## 2021-03-01 NOTE — Op Note (Signed)
[facility] Patient Name: Shelley Watson Procedure Date: 03/01/2021 10:02 AM MRN: [id number] Date of Birth: 30-Nov-1934 Attending MD: Norvel Richards , MD CSN: [id number] Age: 86 Admit Type: Outpatient Procedure:                Upper GI endoscopy Indications:              Surveillance for malignancy due to personal history                            of gastric poyps; chronic diarrhea Providers:                Norvel Richards, MD, Rosina Lowenstein, RN, Aram Candela Referring MD:              Medicines:                Midazolam 1 mg IV, Meperidine 25 mg IV Complications:            No immediate complications. Estimated Blood Loss:     Estimated blood loss was minimal. Procedure:                Pre-Anesthesia Assessment:                           - Prior to the procedure, a History and Physical                            was performed, and patient medications and                            allergies were reviewed. The patient's tolerance of                            previous anesthesia was also reviewed. The risks                            and benefits of the procedure and the sedation                            options and risks were discussed with the patient.                            All questions were answered, and informed consent                            was obtained. Prior Anticoagulants: The patient has                            taken no previous anticoagulant or antiplatelet                            agents. ASA Grade Assessment: III - A patient with  severe systemic disease. After reviewing the risks                            and benefits, the patient was deemed in                            satisfactory condition to undergo the procedure.                           After obtaining informed consent, the endoscope was                            passed under direct vision. Throughout the                             procedure, the patient's blood pressure, pulse, and                            oxygen saturations were monitored continuously. The                            [id number]-[id number]) scope was introduced through the                            mouth, and advanced to the second part of duodenum.                            The upper GI endoscopy was accomplished without                            difficulty. The patient tolerated the procedure                            well. Scope In: 10:37:18 AM Scope Out: 10:51:46 AM Total Procedure Duration: 0 hours 14 minutes 28 seconds  Findings:      The examined esophagus was normal. Numerous gastric hyperplastic polyps       likely over 2 dozen ranging from 5 mm to largest 1.25 cm; several had       ulcerated heads. Many friable. No obvious infiltrating process seen.       Pylorus patent.      The duodenal bulb and second portion of the duodenum were normal. This       was biopsied with a cold forceps for histology. Subsequently, gastric       mucosal biopsies were taken to check for H. pylori. The largest polyp in       the stomach was removed with hot snare cautery the polypectomy site was       sealed with a hemostasis clip. An adjacent 8 mm necrotic appearing polyp       was removed with cold snare. Impression:               - Normal esophagus. Numerous ulcerated, friable                            benign appearing gastric polyps -  status post                            gastric polypectomy and gastric biopsy. Hemostasis                            clip x1 placed                           - Normal duodenal bulb and second portion of the                            duodenum. Biopsied. Moderate Sedation:      Moderate (conscious) sedation was administered by the endoscopy nurse       and supervised by the endoscopist. The following parameters were       monitored: oxygen saturation, heart rate, blood pressure, respiratory       rate, EKG, adequacy of  pulmonary ventilation, and response to care.       Total physician intraservice time was 20 minutes. Recommendation:           - Patient has a contact number available for                            emergencies. The signs and symptoms of potential                            delayed complications were discussed with the                            patient. Return to normal activities tomorrow.                            Written discharge instructions were provided to the                            patient.                           - Advance diet as tolerated. Begin Protonix 40 mg                            once daily. No MRI until clip gone follow-up on                            pathology. Office visit with [place] in 6 weeks Procedure Code(s):        --- Professional ---                           [phone], Esophagogastroduodenoscopy, flexible,                            transoral; with biopsy, single or multiple                           G0500, Moderate sedation services provided by the  same physician or other qualified health care                            professional performing a gastrointestinal                            endoscopic service that sedation supports,                            requiring the presence of an independent trained                            observer to assist in the monitoring of the                            patient's level of consciousness and physiological                            status; initial 15 minutes of intra-service time;                            patient age 55 years or older (additional time may                            be reported with [phone], as appropriate) Diagnosis Code(s):        --- Professional ---                           Z86.010, Personal history of colonic polyps CPT copyright 2019 American Medical Association. All rights reserved. The codes documented in this report are preliminary and upon coder review may   be revised to meet current compliance requirements. [name]. [name], MD Norvel Richards, MD 03/01/2021 11:08:16 AM This report has been signed electronically. Number of Addenda: 0

## 2021-03-02 ENCOUNTER — Telehealth: Payer: Self-pay

## 2021-03-02 ENCOUNTER — Encounter: Payer: Self-pay | Admitting: Internal Medicine

## 2021-03-02 LAB — SURGICAL PATHOLOGY

## 2021-03-02 NOTE — Telephone Encounter (Signed)
Pt called office, she took first dose of Protonix 40mg  this morning. She now feels bloated and doesn't want to take Protonix anymore. Omeprazole also made her feel the same way in the past. She took Prevacid before with no problems. She wants Dr. Gala Romney to know how she is feeling.

## 2021-03-02 NOTE — Telephone Encounter (Signed)
Ok; stop protonix ; begin prevacid 30 mg daily (disp 30 with 11 refills); keep f/u appt.

## 2021-03-03 MED ORDER — LANSOPRAZOLE 30 MG PO CPDR: 30.0000 mg | DELAYED_RELEASE_CAPSULE | Freq: Every day | ORAL | 11 refills | Status: DC

## 2021-03-03 NOTE — Telephone Encounter (Signed)
Rx for Prevacid sent to pharmacy. She isn't scheduled at this time for a f/u but per procedure report she needs f/u in 6 weeks.  Called and informed pt of Dr. Roseanne Kaufman advice. Informed her f/u can be scheduled when Dr. Roseanne Kaufman July schedule is available.  Manuela Schwartz, please schedule 6 week f/u with Dr. Gala Romney when July schedule is available.

## 2021-03-03 NOTE — Telephone Encounter (Signed)
Letter has been mailed to the pt  

## 2021-03-03 NOTE — Telephone Encounter (Signed)
Per Dr.Rourk- Send letter to patient.  Send copy of letter with path to referring provider and PCP.   Needs OV with leslie lewis in 3-4 weeks if not already scheduled

## 2021-03-03 NOTE — Telephone Encounter (Signed)
ON July RECALL TO SEE RMR OR FIRST AVAILABLE OPENING WITH [facility]

## 2021-03-03 NOTE — Telephone Encounter (Signed)
noted 

## 2021-03-07 ENCOUNTER — Encounter (HOSPITAL_COMMUNITY): Payer: Self-pay | Admitting: Internal Medicine

## 2021-04-18 ENCOUNTER — Encounter: Payer: Self-pay | Admitting: Internal Medicine

## 2021-05-26 ENCOUNTER — Other Ambulatory Visit: Payer: Self-pay | Admitting: *Deleted

## 2021-05-26 ENCOUNTER — Other Ambulatory Visit: Payer: Self-pay

## 2021-05-26 ENCOUNTER — Encounter: Payer: Self-pay | Admitting: Internal Medicine

## 2021-05-26 ENCOUNTER — Ambulatory Visit (INDEPENDENT_AMBULATORY_CARE_PROVIDER_SITE_OTHER): Payer: Medicare Other | Admitting: Internal Medicine

## 2021-05-26 ENCOUNTER — Encounter: Payer: Self-pay | Admitting: *Deleted

## 2021-05-26 VITALS — BP 182/75 | HR 69 | Temp 97.7°F | Ht 62.0 in | Wt 191.6 lb

## 2021-05-26 DIAGNOSIS — K317 Polyp of stomach and duodenum: Secondary | ICD-10-CM

## 2021-05-26 DIAGNOSIS — R1033 Periumbilical pain: Secondary | ICD-10-CM | POA: Diagnosis not present

## 2021-05-26 DIAGNOSIS — R197 Diarrhea, unspecified: Secondary | ICD-10-CM | POA: Diagnosis not present

## 2021-05-26 NOTE — Patient Instructions (Signed)
It was good to see you again today  May use MiraLAX 1 capful in 8 ounces of water at bedtime daily as needed for constipation  As discussed, we will proceed with a contrast CT of the abdomen pelvis to further evaluate periumbilical pain  No future EGD or colonoscopy recommended  Further recommendations to follow-up once CT scan is available for review.

## 2021-05-26 NOTE — Progress Notes (Signed)
Primary Care Physician:  Earney Mallet, MD Primary Gastroenterologist:  Dr. Gala Romney  Pre-Procedure History & Physical: HPI:  Shelley Watson is a 86 y.o. female here for follow-up of diarrhea and hyperplastic polyps.  Recent EGD demonstrated multiple hyperplastic polyps largest polyp removed benign biopsy.  No H. pylori.  Also, small bowel biopsies normal.  Metformin has been decreased.  This has been associated with resolution of diarrhea.  In fact, constipated as she has to take something periodically to move her bowels now.  No rectal bleeding.  No future colonoscopy recommended.  Patient has a complaint today of periumbilical pain history of umbilical hernia repair about 8 years ago in [place].  Does not protrude.  She notices a lump and its painful.  Stool studies previously negative.  Past Medical History:  Diagnosis Date   A-fib [facility])    after CABG   CAD (coronary artery disease)    stent placement   DDD (degenerative disc disease), lumbar    Diabetes ([place])    Dysrhythmia    A-fib   Glaucoma    Hypertension    Spinal stenosis     Past Surgical History:  Procedure Laterality Date   BIOPSY  03/01/2021   Procedure: BIOPSY;  Surgeon: Daneil Dolin, MD;  [facility];  Service: Endoscopy;;   BREAST SURGERY     X5   CATARACT EXTRACTION, BILATERAL     CHOLECYSTECTOMY  1987   COLONOSCOPY     Dr. Algis Greenhouse   COLONOSCOPY N/A 08/07/2017   Dr. Gala Romney: Tubular adenoma removed, internal hemorrhoids.  No future surveillance colonoscopies due to age.   CORONARY ANGIOPLASTY WITH STENT PLACEMENT  2003   CORONARY ARTERY BYPASS GRAFT  10/2016   [facility]   ESOPHAGOGASTRODUODENOSCOPY N/A 08/07/2017   Dr. Gala Romney: Numerous gastric polyps, largest one sent for path and was hyperplastic with intestinal metaplasia.   ESOPHAGOGASTRODUODENOSCOPY N/A 03/01/2021   Procedure: ESOPHAGOGASTRODUODENOSCOPY (EGD);  Surgeon: Daneil Dolin, MD;  [facility];  Service: Endoscopy;   Laterality: N/A;  AM   HEMORRHOID SURGERY     HEMOSTASIS CLIP PLACEMENT  03/01/2021   Procedure: HEMOSTASIS CLIP PLACEMENT;  Surgeon: Daneil Dolin, MD;  [facility];  Service: Endoscopy;;   POLYPECTOMY  08/07/2017   Procedure: POLYPECTOMY;  Surgeon: Daneil Dolin, MD;  [facility];  Service: Endoscopy;;  Ascending colon & Gastric (x2)    POLYPECTOMY  03/01/2021   Procedure: POLYPECTOMY;  Surgeon: Daneil Dolin, MD;  [facility];  Service: Endoscopy;;   TUBAL LIGATION     UMBILICAL HERNIA REPAIR  2013    Prior to Admission medications   Medication Sig Start Date End Date Taking? Authorizing Provider  acetaminophen (TYLENOL) 500 MG tablet Take 1,000 mg by mouth 2 (two) times daily as needed for mild pain.   Yes [provider]  amLODipine (NORVASC) 5 MG tablet Take 5 mg by mouth daily.   Yes [provider]  aspirin 81 MG chewable tablet Chew 81 mg by mouth at bedtime.   Yes [provider]  atorvastatin (LIPITOR) 40 MG tablet Take 40 mg by mouth at bedtime.   Yes [provider]  Camphor-Menthol-Methyl Sal (SALONPAS) 3.10-27-08 % PTCH Apply 1 patch topically daily as needed (pain).   Yes [provider]  Carboxymethylcellul-Glycerin (EQ LUBRICATING EYE DROPS OP) Apply 1 drop to eye in the morning, at noon, and at bedtime.   Yes [provider]  cholecalciferol (VITAMIN D) 1000 units tablet Take 1,000 Units by mouth daily.   Yes [provider]  Ferrous Sulfate (IRON) 325 (65 Fe) MG TABS Take 325 mg by mouth daily with breakfast.   Yes [provider]  furosemide (LASIX) 20 MG tablet Take 20 mg by mouth every Monday, Wednesday, and Friday. Hold if no ankle swelling. 01/02/21  Yes [provider]  glimepiride (AMARYL) 1 MG tablet Take 1 mg by mouth 2 (two) times daily with breakfast and lunch. Breakfast and lunch 01/24/21  Yes [provider]  hydrochlorothiazide  (HYDRODIURIL) 12.5 MG tablet Take 12.5 mg by mouth every morning. 12/05/20  Yes [provider]  lisinopril (ZESTRIL) 20 MG tablet Take 20 mg by mouth 2 (two) times daily. 01/24/21  Yes [provider]  Magnesium 500 MG TABS Take 250 mg by mouth daily.   Yes [provider]  metFORMIN (GLUCOPHAGE) 500 MG tablet Take 500 mg by mouth 2 (two) times daily with a meal. 04/30/17  Yes [provider]  metoprolol tartrate (LOPRESSOR) 25 MG tablet Take 12.5 mg by mouth 2 (two) times daily. 10/26/20  Yes [provider]  Multiple Vitamin (MULTIVITAMIN) tablet Take 1 tablet by mouth daily. Takes when she remembers   Yes [provider]  potassium chloride (KLOR-CON) 10 MEQ tablet Take 10 mEq by mouth every Monday, Wednesday, and Friday. Take with each furosemide dose. 12/06/20  Yes [provider]  triamcinolone (NASACORT) 55 MCG/ACT AERO nasal inhaler Place 2 sprays into the nose daily as needed (allergies).   Yes [provider]  vitamin B-12 (CYANOCOBALAMIN) 1000 MCG tablet Take 1,000 mcg by mouth daily.   Yes [provider]    Allergies as of 05/26/2021 - Review Complete 05/26/2021  Allergen Reaction Noted   Aleve [naproxen sodium] Shortness Of Breath and Swelling 11/13/2016   Other  [id number]   Garlic  [id number]   Omeprazole  02/26/2018   Prednisone  07/22/2017   Penicillins Rash 07/22/2017    Family History  Problem Relation Age of Onset   Bladder Cancer Brother    Heart disease Mother    Atrial fibrillation Sister    Colon cancer Neg Hx     Social History   Socioeconomic History   Marital status: Married    Spouse name: Not on file   Number of children: Not on file   Years of education: Not on file   Highest education level: Not on file  Occupational History   Not on file  Tobacco Use   Smoking status: Never   Smokeless tobacco: Never  Vaping Use   Vaping Use: Never used  Substance and Sexual Activity    Alcohol use: No   Drug use: No   Sexual activity: Not on file  Other Topics Concern   Not on file  Social History Narrative   Not on file   Social Determinants of Health   Financial Resource Strain: Not on file  Food Insecurity: Not on file  Transportation Needs: Not on file  Physical Activity: Not on file  Stress: Not on file  Social Connections: Not on file  Intimate Partner Violence: Not on file    Review of Systems: See HPI, otherwise negative ROS  Physical Exam: BP (!) 182/75   Pulse 69   Temp 97.7 F (36.5 C)   Ht '5\' 2"'$  (1.575 m)   Wt 191 lb 9.6 oz (86.9 kg)   BMI 35.04 kg/m  General:   Alert,  Well-developed, well-nourished, pleasant and cooperative in NAD.  Husband accompanies her today. Neck:  Supple; no masses or thyromegaly. No significant cervical adenopathy. Lungs:  Clear throughout to auscultation.   No wheezes, crackles, or rhonchi. No acute distress. Heart:  Regular rate and rhythm; no murmurs, clicks, rubs,  or gallops. Abdomen: Obese.  Positive bowel sounds.  Palpation of the umbilicus does not reveal a fascial defect however there is a very hard firm irregular ping-pong ball sized lump in the base the umbilicus.  It is firm and tender.     Impression/Plan: 86 year old lady with a recent diarrheal illness improved with reduction in dose of metformin.  I doubt foodborne illness as previously suggested.  She has a tendency towards constipation.  This should be easily managed.  History of hyperplastic polyps without dysplasia no H. pylori.  Further evaluation of her upper upper GI tract not warranted unless new symptoms develop.  Periumbilical pain.  Abnormal examination of umbilicus today.  Further evaluation warranted.   Recommendations: May use MiraLAX 1 capful in 8 ounces of water at bedtime daily as needed for constipation  As discussed, we will proceed with a contrast CT of the abdomen pelvis to further evaluate periumbilical pain  No future  EGD or colonoscopy recommended  Further recommendations to follow-up once CT scan is available for review.  Notice: This dictation was prepared with Dragon dictation along with smaller phrase technology. Any transcriptional errors that result from this process are unintentional and may not be corrected upon review.

## 2021-06-23 ENCOUNTER — Other Ambulatory Visit: Payer: Self-pay

## 2021-06-23 ENCOUNTER — Ambulatory Visit (HOSPITAL_COMMUNITY)
Admission: RE | Admit: 2021-06-23 | Discharge: 2021-06-23 | Disposition: A | Discharge status: Home / Self Care | Payer: Medicare Other | Source: Ambulatory Visit | Attending: Internal Medicine | Admitting: Internal Medicine

## 2021-06-23 DIAGNOSIS — R1033 Periumbilical pain: Secondary | ICD-10-CM | POA: Insufficient documentation

## 2021-06-23 LAB — POCT I-STAT CREATININE: Creatinine, Ser: 1.2 mg/dL — ABNORMAL HIGH (ref 0.44–1.00)

## 2021-06-23 MED ORDER — IOHEXOL 350 MG/ML SOLN
85.0000 mL | Freq: Once | INTRAVENOUS | Status: AC | PRN
  Administered 2021-06-23: 60 mL via INTRAVENOUS

## 2021-06-27 ENCOUNTER — Telehealth: Payer: Self-pay | Admitting: Internal Medicine

## 2021-06-27 DIAGNOSIS — K869 Disease of pancreas, unspecified: Secondary | ICD-10-CM

## 2021-06-27 NOTE — Telephone Encounter (Signed)
[facility] called with call report on pt's recent CT. Wanted to make the provider aware of #3 under impression. Sending to [facility] in Dr. Roseanne Kaufman absence.

## 2021-06-28 NOTE — Telephone Encounter (Signed)
See result note for details.   Tammy, FYI. Nothing for you to do.

## 2021-06-28 NOTE — Telephone Encounter (Signed)
Unable to make note on CT report itself.   CT without explanation of periumbilical abdominal pain.  Patient states this is mild and intermittent, occurring once or twice every 2 to 3 weeks, and not affected by meals.  Reviewed additional incidental findings including liver nodularity suggesting cirrhosis as well as 2 cm pancreatic lesion with recommendations for MRI/MRCP.  Patient states she had a clip placed in her stomach and cannot have an MRI until she has an x-ray.  I let her know I reviewed the x-ray with the radiologist (Dr. Tery Sanfilippo) who states he did not see a clip anywhere in her GI tract.  Patient prefers to think about the recommendations for MRI and discuss with Dr. Gala Romney when he returns.  Forwarding to Dr. Gala Romney who ordered CT for additional recommendations.

## 2021-07-03 NOTE — Telephone Encounter (Signed)
Communication noted.  

## 2021-07-03 NOTE — Addendum Note (Signed)
Addended by: Zara Council C on: 07/03/2021 11:34 AM   Modules accepted: Orders

## 2021-07-03 NOTE — Telephone Encounter (Signed)
Lmom for pt to return my call.  

## 2021-07-03 NOTE — Telephone Encounter (Signed)
MRI/MRCP scheduled for 07/11/21 at 5:00pm, arrive at 4:30pm. NPO 4 hours prior to test.  Called and informed pt of MRI/MRCP appt. Letter mailed.

## 2021-07-03 NOTE — Telephone Encounter (Signed)
Spoke with pt and made her aware of Dr. Roseanne Kaufman recommendations. Pt verbalized understanding and is ok with recommendations. Forwarded to clinical pool to schedule.

## 2021-07-11 ENCOUNTER — Other Ambulatory Visit: Payer: Self-pay

## 2021-07-11 ENCOUNTER — Other Ambulatory Visit: Payer: Self-pay | Admitting: Internal Medicine

## 2021-07-11 ENCOUNTER — Ambulatory Visit (HOSPITAL_COMMUNITY)
Admission: RE | Admit: 2021-07-11 | Discharge: 2021-07-11 | Disposition: A | Discharge status: Home / Self Care | Payer: Medicare Other | Source: Ambulatory Visit | Attending: Gastroenterology | Admitting: Gastroenterology

## 2021-07-11 DIAGNOSIS — K869 Disease of pancreas, unspecified: Secondary | ICD-10-CM | POA: Diagnosis not present

## 2021-07-11 MED ORDER — GADOBUTROL 1 MMOL/ML IV SOLN
10.0000 mL | Freq: Once | INTRAVENOUS | Status: AC | PRN
  Administered 2021-07-11: 10 mL via INTRAVENOUS

## 2021-07-25 ENCOUNTER — Encounter: Payer: Self-pay | Admitting: Internal Medicine

## 2021-12-12 ENCOUNTER — Ambulatory Visit: Payer: Medicare Other | Admitting: Gastroenterology

## 2022-03-26 ENCOUNTER — Ambulatory Visit: Payer: Medicare Other | Admitting: Gastroenterology

## 2023-02-21 IMAGING — CT CT ABD-PELV W/ CM
2 of 5 series · 16 of 46 positions shown, 18 images · IV contrast (Omnipaque or Isovue)
Comparison: None.

CLINICAL DATA: Periumbilical pain for 5 months.

EXAM:
CT ABDOMEN AND PELVIS WITH CONTRAST
TECHNIQUE: Multidetector CT imaging of the abdomen and pelvis was performed
using the standard protocol following bolus administration of
intravenous contrast.
CONTRAST:  60mL OMNIPAQUE IOHEXOL 350 MG/ML SOLN

[Series 2: axial st · axial · 0.94mm/px · z∈[+1020,+1395]mm · 13 of 86 slices shown, 15 images]
[im 6/86  soft-tissue]
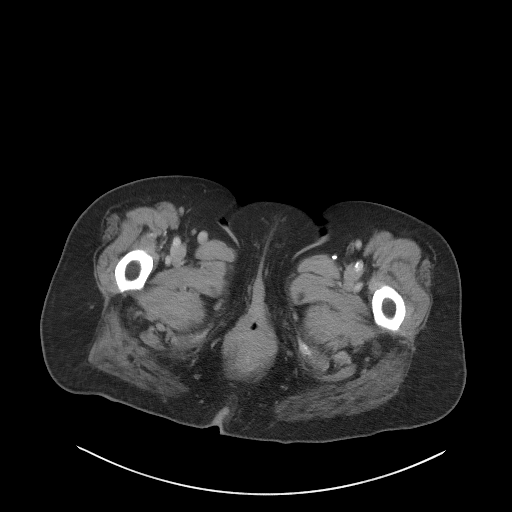
[im 6/86  bone]
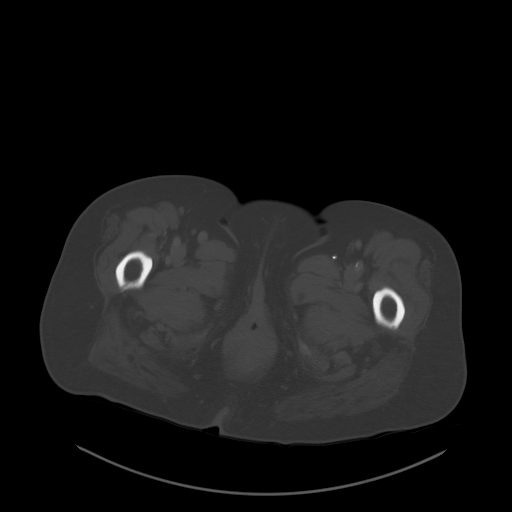
[im 11/86  soft-tissue]
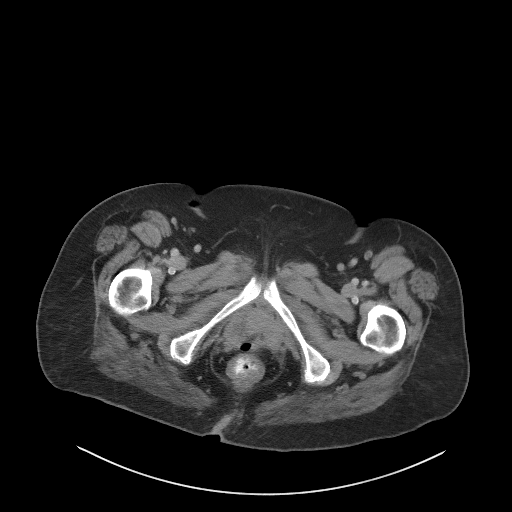
[im 21/86  soft-tissue]
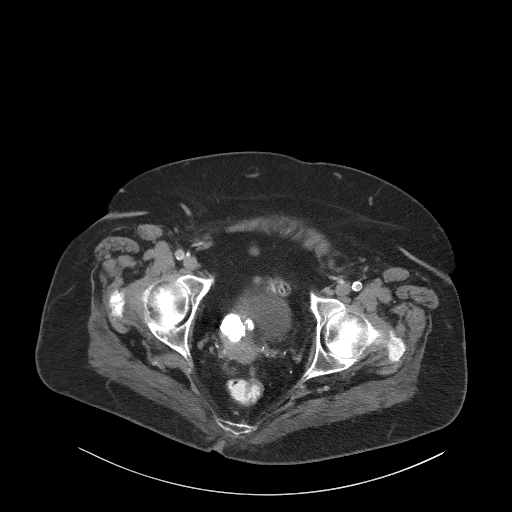
[im 26/86  soft-tissue]
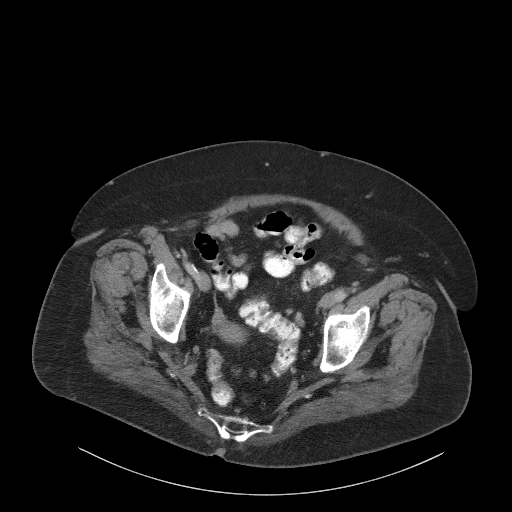
[im 31/86  soft-tissue]
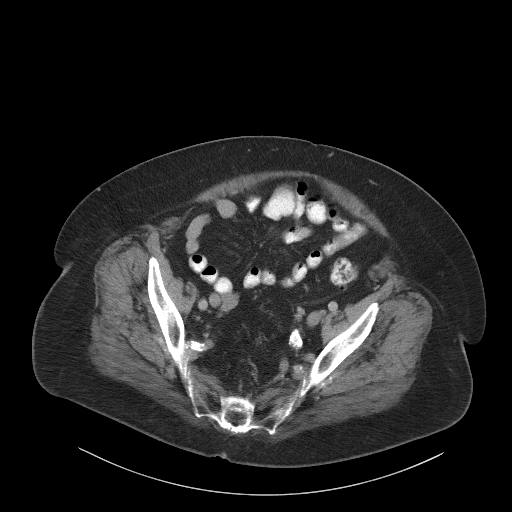
[im 36/86  soft-tissue]
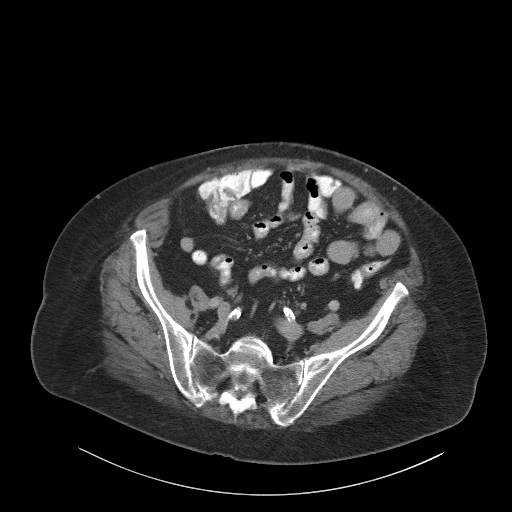
[im 46/86  soft-tissue]
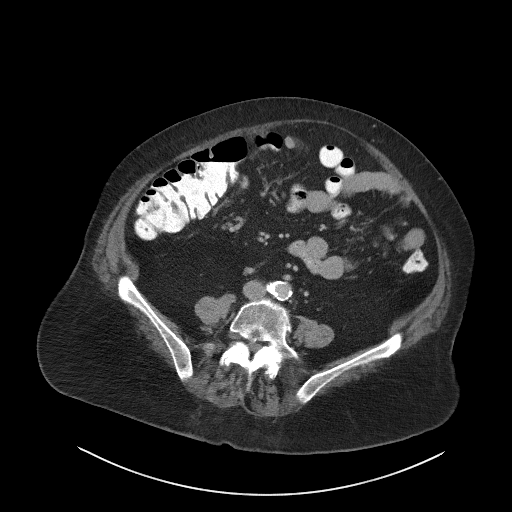
[im 51/86  soft-tissue]
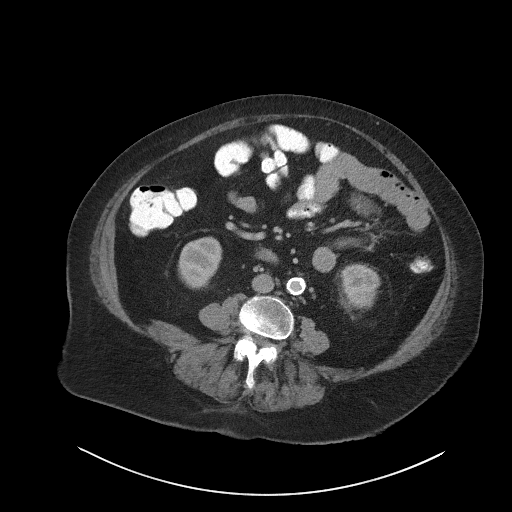
[im 56/86  soft-tissue]
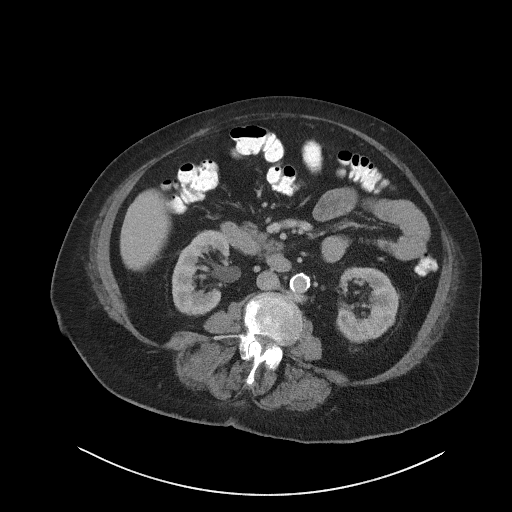
[im 56/86  bone]
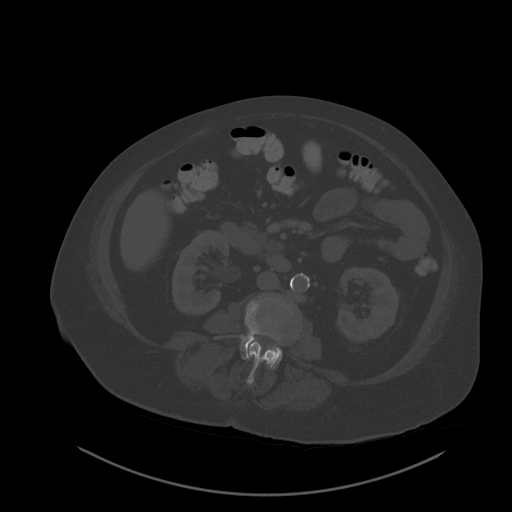
[im 61/86  soft-tissue]
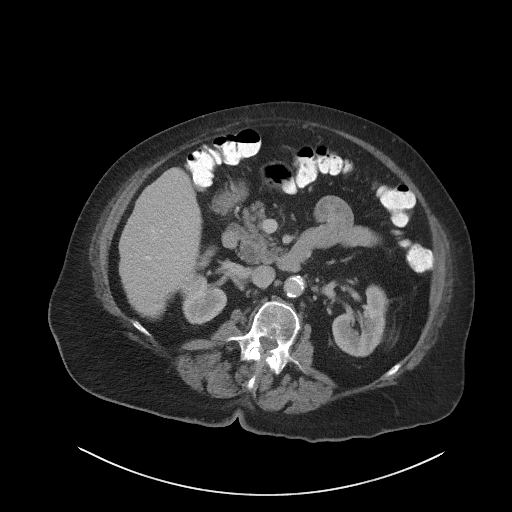
[im 66/86  soft-tissue]
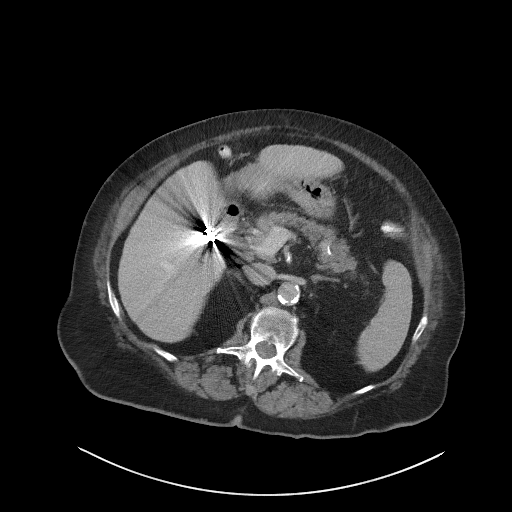
[im 76/86  soft-tissue]
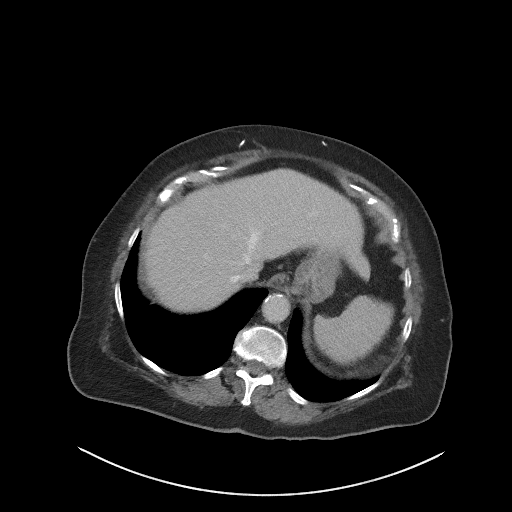
[im 81/86  soft-tissue]
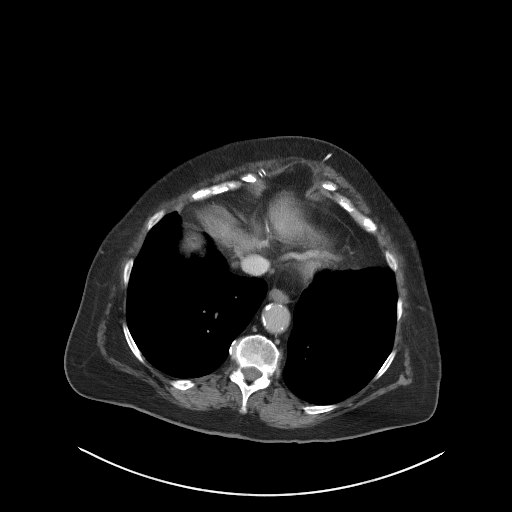

[Series 5: coronal st · coronal · 0.83mm/px · 3 of 121 slices shown]
[im 41/121  soft-tissue]
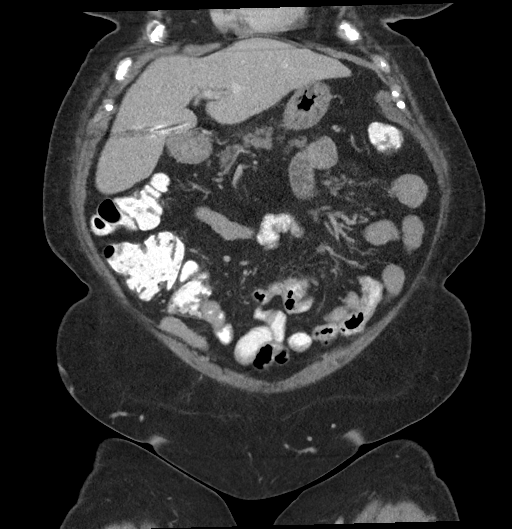
[im 54/121  soft-tissue]
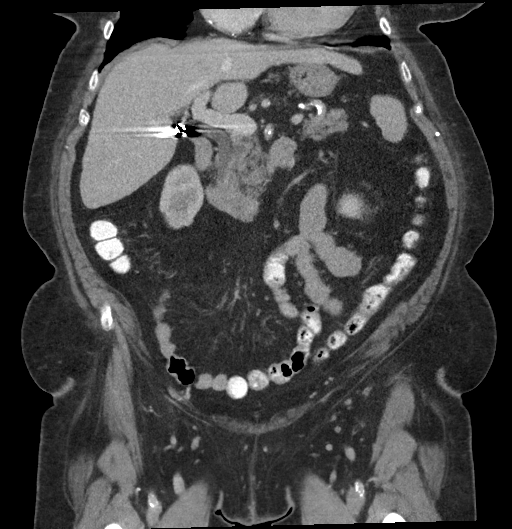
[im 67/121  soft-tissue]
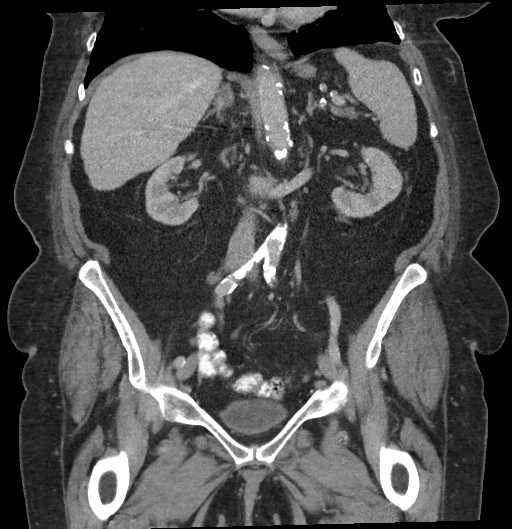

[16 of 46 positions shown; findings below may reference images not displayed]

FINDINGS: Lower chest: Unremarkable.

Hepatobiliary: Nodular liver contour suggests cirrhosis. No
suspicious focal abnormality within the liver parenchyma.
Gallbladder is surgically absent. No intrahepatic or extrahepatic
biliary dilation.

Pancreas: 2 cm hypoattenuating lesion identified in the head of the
pancreas. No main duct dilatation.

Spleen: No splenomegaly. No focal mass lesion.

Adrenals/Urinary Tract: No adrenal nodule or mass. Kidneys
unremarkable. No evidence for hydroureter. The urinary bladder
appears normal for the degree of distention.

Stomach/Bowel: Stomach is decompressed. Duodenum is normally
positioned as is the ligament of Treitz. No small bowel wall
thickening. No small bowel dilatation. The terminal ileum is normal.
The appendix is not well visualized, but there is no edema or
inflammation in the region of the cecum. No gross colonic mass. No
colonic wall thickening. Diverticular changes are noted in the left
colon without evidence of diverticulitis.

Vascular/Lymphatic: There is advanced atherosclerotic calcification
of the abdominal aorta without aneurysm. There is no gastrohepatic
or hepatoduodenal ligament lymphadenopathy. No retroperitoneal or
mesenteric lymphadenopathy. No pelvic sidewall lymphadenopathy.

Reproductive: Calcified fibroids noted in the uterus. There is no
adnexal mass.

Other: No intraperitoneal free fluid.

Musculoskeletal: No worrisome lytic or sclerotic osseous
abnormality.
IMPRESSION: 1. No acute findings in the abdomen or pelvis. Specifically, no
findings to explain the patient's history of periumbilical pain.
2. Nodular liver contour suggests cirrhosis.
3. 2 cm hypoattenuating lesion in the head of the pancreas, likely
cystic. MRI/MRCP of the abdomen without and with contrast
recommended to further evaluate.
4. Aortic Atherosclerosis ([id number]-[id number].E).

These results will be called to the ordering clinician or
representative by the Radiologist Assistant, and communication
documented in the PACS or [REDACTED].

## 2023-06-17 ENCOUNTER — Encounter: Payer: Self-pay | Admitting: Internal Medicine
